# Patient Record
Sex: Female | Born: 1980
Health system: Southern US, Community
[De-identification: ages and names within clinical notes are randomized; demographics above are authoritative.]

## PROBLEM LIST (undated history)

## (undated) DIAGNOSIS — N920 Excessive and frequent menstruation with regular cycle: Secondary | ICD-10-CM

## (undated) DIAGNOSIS — R011 Cardiac murmur, unspecified: Secondary | ICD-10-CM

## (undated) DIAGNOSIS — F419 Anxiety disorder, unspecified: Secondary | ICD-10-CM

## (undated) DIAGNOSIS — F319 Bipolar disorder, unspecified: Secondary | ICD-10-CM

## (undated) DIAGNOSIS — E282 Polycystic ovarian syndrome: Secondary | ICD-10-CM

## (undated) DIAGNOSIS — E663 Overweight: Secondary | ICD-10-CM

## (undated) DIAGNOSIS — G43909 Migraine, unspecified, not intractable, without status migrainosus: Secondary | ICD-10-CM

## (undated) DIAGNOSIS — N926 Irregular menstruation, unspecified: Secondary | ICD-10-CM

## (undated) DIAGNOSIS — Z319 Encounter for procreative management, unspecified: Secondary | ICD-10-CM

## (undated) DIAGNOSIS — L700 Acne vulgaris: Secondary | ICD-10-CM

## (undated) DIAGNOSIS — L689 Hypertrichosis, unspecified: Secondary | ICD-10-CM

## (undated) DIAGNOSIS — N6324 Unspecified lump in the left breast, lower inner quadrant: Principal | ICD-10-CM

## (undated) DIAGNOSIS — B373 Candidiasis of vulva and vagina: Secondary | ICD-10-CM

## (undated) DIAGNOSIS — N898 Other specified noninflammatory disorders of vagina: Principal | ICD-10-CM

## (undated) HISTORY — DX: Hypertrichosis, unspecified: L68.9

## (undated) HISTORY — DX: Other specified noninflammatory disorders of vagina: N89.8

## (undated) HISTORY — DX: Irregular menstruation, unspecified: N92.6

## (undated) HISTORY — DX: Migraine, unspecified, not intractable, without status migrainosus: G43.909

## (undated) HISTORY — DX: Excessive and frequent menstruation with regular cycle: N92.0

## (undated) HISTORY — DX: Cardiac murmur, unspecified: R01.1

## (undated) HISTORY — DX: Encounter for procreative management, unspecified: Z31.9

## (undated) HISTORY — DX: Acne vulgaris: L70.0

## (undated) HISTORY — DX: Overweight: E66.3

## (undated) HISTORY — DX: Bipolar disorder, unspecified: F31.9

## (undated) HISTORY — DX: Anxiety disorder, unspecified: F41.9

## (undated) HISTORY — DX: Candidiasis of vulva and vagina: B37.3

## (undated) HISTORY — DX: Polycystic ovarian syndrome: E28.2

## (undated) HISTORY — DX: Unspecified lump in the left breast, lower inner quadrant: N63.24

---

## 2002-01-24 ENCOUNTER — Encounter: Payer: Self-pay | Admitting: Emergency Medicine

## 2002-01-24 ENCOUNTER — Emergency Department (HOSPITAL_COMMUNITY): Admission: EM | Admit: 2002-01-24 | Discharge: 2002-01-24 | Payer: Self-pay | Admitting: Emergency Medicine

## 2003-01-05 ENCOUNTER — Other Ambulatory Visit: Admission: RE | Admit: 2003-01-05 | Discharge: 2003-01-05 | Payer: Self-pay | Admitting: Unknown Physician Specialty

## 2003-04-02 ENCOUNTER — Emergency Department (HOSPITAL_COMMUNITY): Admission: EM | Admit: 2003-04-02 | Discharge: 2003-04-02 | Payer: Self-pay | Admitting: Emergency Medicine

## 2003-12-21 ENCOUNTER — Emergency Department (HOSPITAL_COMMUNITY): Admission: EM | Admit: 2003-12-21 | Discharge: 2003-12-21 | Payer: Self-pay | Admitting: Emergency Medicine

## 2004-02-14 ENCOUNTER — Inpatient Hospital Stay (HOSPITAL_COMMUNITY): Admission: EM | Admit: 2004-02-14 | Discharge: 2004-02-18 | Payer: Self-pay | Admitting: Psychiatry

## 2004-02-14 ENCOUNTER — Ambulatory Visit: Payer: Self-pay | Admitting: Psychiatry

## 2004-02-23 ENCOUNTER — Ambulatory Visit (HOSPITAL_COMMUNITY): Admission: RE | Admit: 2004-02-23 | Discharge: 2004-02-23 | Payer: Self-pay | Admitting: Otolaryngology

## 2004-07-13 ENCOUNTER — Emergency Department (HOSPITAL_COMMUNITY): Admission: EM | Admit: 2004-07-13 | Discharge: 2004-07-13 | Payer: Self-pay | Admitting: Emergency Medicine

## 2005-12-01 ENCOUNTER — Ambulatory Visit: Payer: Self-pay | Admitting: Internal Medicine

## 2005-12-08 ENCOUNTER — Ambulatory Visit: Payer: Self-pay | Admitting: Internal Medicine

## 2005-12-08 ENCOUNTER — Ambulatory Visit (HOSPITAL_COMMUNITY): Admission: RE | Admit: 2005-12-08 | Discharge: 2005-12-08 | Payer: Self-pay | Admitting: Internal Medicine

## 2005-12-10 HISTORY — PX: BUNIONECTOMY: SHX129

## 2005-12-17 ENCOUNTER — Ambulatory Visit (HOSPITAL_COMMUNITY): Admission: RE | Admit: 2005-12-17 | Discharge: 2005-12-17 | Payer: Self-pay | Admitting: Podiatry

## 2007-04-16 ENCOUNTER — Ambulatory Visit (HOSPITAL_COMMUNITY): Admission: RE | Admit: 2007-04-16 | Discharge: 2007-04-16 | Payer: Self-pay | Admitting: Family Medicine

## 2007-04-20 ENCOUNTER — Ambulatory Visit: Payer: Self-pay | Admitting: Internal Medicine

## 2007-05-13 HISTORY — PX: COLONOSCOPY: SHX174

## 2007-05-28 ENCOUNTER — Ambulatory Visit: Payer: Self-pay | Admitting: Urgent Care

## 2007-06-04 ENCOUNTER — Ambulatory Visit: Payer: Self-pay | Admitting: Internal Medicine

## 2007-06-04 ENCOUNTER — Ambulatory Visit (HOSPITAL_COMMUNITY): Admission: RE | Admit: 2007-06-04 | Discharge: 2007-06-04 | Payer: Self-pay | Admitting: Internal Medicine

## 2007-07-06 ENCOUNTER — Ambulatory Visit: Payer: Self-pay | Admitting: Internal Medicine

## 2008-03-23 DIAGNOSIS — Z8719 Personal history of other diseases of the digestive system: Secondary | ICD-10-CM | POA: Insufficient documentation

## 2008-03-23 DIAGNOSIS — K219 Gastro-esophageal reflux disease without esophagitis: Secondary | ICD-10-CM | POA: Insufficient documentation

## 2008-03-23 DIAGNOSIS — R011 Cardiac murmur, unspecified: Secondary | ICD-10-CM | POA: Insufficient documentation

## 2008-03-23 DIAGNOSIS — R109 Unspecified abdominal pain: Secondary | ICD-10-CM | POA: Insufficient documentation

## 2008-03-23 DIAGNOSIS — G43909 Migraine, unspecified, not intractable, without status migrainosus: Secondary | ICD-10-CM | POA: Insufficient documentation

## 2008-03-23 DIAGNOSIS — K5900 Constipation, unspecified: Secondary | ICD-10-CM | POA: Insufficient documentation

## 2008-06-01 ENCOUNTER — Other Ambulatory Visit: Admission: RE | Admit: 2008-06-01 | Discharge: 2008-06-01 | Payer: Self-pay | Admitting: Obstetrics and Gynecology

## 2008-06-06 ENCOUNTER — Ambulatory Visit (HOSPITAL_COMMUNITY): Admission: RE | Admit: 2008-06-06 | Discharge: 2008-06-06 | Payer: Self-pay | Admitting: Obstetrics and Gynecology

## 2008-10-07 ENCOUNTER — Emergency Department (HOSPITAL_COMMUNITY): Admission: EM | Admit: 2008-10-07 | Discharge: 2008-10-07 | Payer: Self-pay | Admitting: Emergency Medicine

## 2010-04-03 ENCOUNTER — Other Ambulatory Visit
Admission: RE | Admit: 2010-04-03 | Discharge: 2010-04-03 | Payer: Self-pay | Source: Home / Self Care | Admitting: Obstetrics and Gynecology

## 2010-09-24 NOTE — Op Note (Signed)
NAME:  [name], [name]              ACCOUNT NO.:  [id number]   MEDICAL RECORD NO.:  [id number]          PATIENT TYPE:  AMB   LOCATION:  DAY                           FACILITY:  [facility]   PHYSICIAN:  R. Roetta Sessions, M.D. DATE OF BIRTH:  06-08-1980   DATE OF PROCEDURE:  06/04/2007  DATE OF DISCHARGE:                               OPERATIVE REPORT   ILEOCOLONOSCOPY:   INDICATIONS FOR PROCEDURE:  A 30 year old lady with a history of  irritable bowel syndrome who previously had diarrhea predominant, right  now has been more constipated over last several months and has had a  suboptimal response to Amitiza and MiraLax.  She has wiped blood per  rectum on occasion.  Colonoscopy is now being done.  This approach has  been discussed with the patient at length.  Potential risks, benefits  and alternatives have been reviewed, questions answered, she is  agreeable.  Please see documentation in the medical record.   PROCEDURE NOTE:  O2 saturation, blood pressure, pulse and respirations  were monitored throughout the entire procedure.  Conscious sedation:  Versed 5 mg IV, Demerol 125 mg IV in divided doses.  Instrument:  Pentax  video chip system.   FINDINGS:  Digital rectal exam revealed no abnormalities.  Endoscopic  findings:  The prep was good.   Colon:  Colonic mucosa was surveyed from the rectosigmoid junction  through the left, transverse and right colon to the area of the  appendiceal orifice, ileocecal valve and cecum.  These structures were  well-seen and photographed for the record.  The terminal ileum was  intubated 5 cm.  From this level the scope was slowly withdrawn.  All  previously-mentioned mucosal surfaces were again seen.  The colonic  mucosa as well as the terminal ileal mucosa appeared entirely normal.  The scope was pulled down to the rectum.  Examination of the rectal  mucosa including retroflex view of the anal verge and en face view of  the anal canal demonstrated no  abnormalities.  The patient tolerated the  procedure well, was reacted in endoscopy.   IMPRESSION:  Normal rectum, colon and terminal ileum.   RECOMMENDATIONS:  Will revisit the use of MiraLax and we will escalate  the dose.  We will hold off on the Amitiza, although I may consider  adding that back to her regimen.  Depending on her clinical response in  the next several weeks, may consider a sitz marker study.  Plan to see  this nice lady back in the office in 6 weeks for a recheck.      Jonathon Bellows, M.D.  Electronically Signed    RMR/MEDQ  D:  06/04/2007  T:  06/04/2007  Job:  [id number]

## 2010-09-24 NOTE — Assessment & Plan Note (Signed)
NAME:  Sue, Butler               CHART#:  [id number]   DATE:  04/20/2007                       DOB:  [birth year]   CHIEF COMPLAINT:  Abdominal pain, alternating constipation, diarrhea,  and gastroesophageal reflux disease.   SUBJECTIVE:  [name] is here for further evaluation of above stated  symptoms. We last saw her in July 2007. She has a history of GERD. At  time of EGD in July 2007, she had a normal appearing study. Her  esophagus was stretched with a #56 [place] dilator. She had a  small hiatal hernia. She presents today saying that she has been having  fluctuation of her bowel movement, which has occurred for quite some  time. She generally predominantly has more diarrhea but over the last  couple of months, she has had more constipation. She may go 3 or 4 days  in a row without a bowel movement. She feels very bloated and is  uncomfortable at that point. She saw Margarita Mail, [facility], with Dr. Phillips Odor  last week. She was given Amitiza 24 mcg, which she is taking b.i.d. She  has had some nausea related to that but it has helped her bowel  movements. She is having up to 5 or 6 loose stools now. She denies any  blood in the stool. No melena. She is having reflux all the time. It  occurs day and night. She denies any dysphagia or odynophagia or  vomiting. She denies any recent antibiotic use. Her only medication  changes is that she has been on Topamax for migraine headaches since May  of this year. She did well until a couple of months ago. She was started  on Neurontin and she wondered if this was causing her constipation. She  came off the medication about a week ago. She denies any weight loss and  actually has had some weight gain. According to our records, she has  gained about 3 pounds in the last year. She notes that her mother passed  away unexpectedly at age 45 back in May of this year. Cause is really  unknown, possibly related to her asthma.   She saw Margarita Mail  on April 16, 2007. Labs including CBC, C-met, and  TSH were all normal. An abdominal film was unremarkable.   CURRENT MEDICATIONS:  1. Ibuprofen 200 mg p.r.n.  2. Amitiza 24 mcg b.i.d.  3. Topamax 75 mg daily.   ALLERGIES:  NO KNOWN DRUG ALLERGIES.   PAST MEDICAL HISTORY:  1. Migraine headaches since May of this year.  2. History of heart murmur.   PAST SURGICAL HISTORY:  She had a left foot surgery for a bunion.   FAMILY HISTORY:  Mother deceased age 43, suddenly, cause really unknown.  Father is 1 and has hypertension. No family history of colorectal  cancer or inflammatory bowel disease. She has a younger brother who is  healthy.   SOCIAL HISTORY:  She is single. She is employed at [employer] as  a Engineer, materials. She is a non-smoker and occasional alcohol use.   REVIEW OF SYSTEMS:  GASTROINTESTINAL:  See HPI.  CONSTITUTIONAL/CARDIOPULMONARY:  Denies chest pain or shortness of  breath. GENITOURINARY:  Denies any dysuria or hematuria. Her menstrual  cycles are regular.   PHYSICAL EXAMINATION:  VITAL SIGNS:  Weight 224.5. Height 5 foot 1.  Temperature 98.6. Blood pressure 110/76, pulse 64.  GENERAL:  A pleasant, well developed, well nourished, Caucasian female  in no acute distress.  SKIN:  Warm and dry. No jaundice.  HEENT:  Sclerae nonicteric. Oropharyngeal mucosa moist and pink. No  lesions, no erythema, and no exudate. No lymphadenopathy.  CHEST:  Clear to auscultation.  CARDIAC:  Regular rate and rhythm. Normal S1 and S2. No murmur, rub, or  gallop.  ABDOMEN:  Positive bowel sounds. Obese but symmetrical and soft. She has  tenderness throughout her lower abdomen, predominantly in the left lower  quadrant region. No rebound, tenderness or guarding. No abdominal bruits  or hernias. No hepatosplenomegaly or masses.  EXTREMITIES:  No lower extremity edema.   IMPRESSION:  [name] is a 30 year old lady who presents with a  fluctuation of bowel movements from  constipation and diarrhea. In the  past, she has had predominantly diarrhea. Symptoms are very typical of  irritable bowel syndrome. She also has refractory gastroesophageal  reflux disease but has not been on PPI recently. Just restarted on  Protonix. Has noted some improvement of her symptoms. She really is not  displaying any alarm symptoms at this time. Would continue with empiric  treatment and if she does not respond, will consider further workup at  that time.   PLAN:  1. Hemoccult stool x3.  2. Hyomax sublingual q. a.c. and q.h.s. p.r.n. abdominal cramps, #120      with 3 refills.  3. Flora Q  1 daily for 1 month.  4. Stop Protonix. Begin Nexium 40 mg daily, #20, samples provided. She      did well on Nexium previously.  5. She will decrease her Amitiza to 8 mcg b.i.d. with food, only as      needed for constipation. She was advised to stop if she developed      persistent diarrhea, #20, samples provided.  6. She was advised that she needed to use some sort of birth control      while on Amitiza due to risk of miscarriage related to the      medication or birth defects. She agrees and understands.  7. She will call in 2 weeks with a progress report. If she is not      feeling better at that point, will consider further workup.       Tana Coast, P.A.  Electronically Signed     R. Roetta Sessions, M.D.  Electronically Signed    LL/MEDQ  D:  04/20/2007  T:  04/20/2007  Job:  [id number]   cc:   Corrie Mckusick, M.D.  Margarita Mail, [facility]

## 2010-09-24 NOTE — H&P (Signed)
NAME:  Sue, Butler              ACCOUNT NO.:  [id number]   MEDICAL RECORD NO.:  [id number]          PATIENT TYPE:  AMB   LOCATION:  DAY                           FACILITY:  [facility]   PHYSICIAN:  R. Roetta Sessions, M.D. DATE OF BIRTH:  07-Jul-1980   DATE OF ADMISSION:  DATE OF DISCHARGE:  LH                              HISTORY & PHYSICAL   PRIMARY CARE PHYSICIAN:  Dr. Phillips Odor.   CHIEF COMPLAINT:  Change in bowel habits/hematochezia.   HISTORY OF PRESENT ILLNESS:  [name] is a 30 year old female who has  been followed by our office for constipation for the last month.  She  has a history of IBS which has been diarrhea-predominant.  More recently  she has developed constipation.  She has gone several days without a  bowel movement.  She has tried MiraLax 17 grams b.i.d. for about 2  weeks, which has not seemed to help.  She has also tried Amitiza 24 mcg,  caused nausea.  She was decreased to 8 mcg, and she continued to have  problems with nausea and felt that the medicine was not working.  She  has tried some herbal supplements and fiber on her own and did not get  any relief.  She says that she feels as though she needs to go the  bathroom, and is having a significant amount of straining, but without  results.  This is completely different from her normal baseline of  diarrhea.  She has noticed some intermittent small-volume hematochezia,  with wiping on the toilet tissue.  She is also having severe, stabbing-  type rectal pain.  She denies any nausea, vomiting.  Her GERD is well-  controlled on Nexium.   She has had cramp-like intermittent right upper quadrant and left upper  quadrant pain, can last for several hours, is 8/10 on pain scale and  relatively constant.   LABORATORY:  She had laboratory studies on April 16, 2007, which  showed a normal TSH, normal CMP and normal CBC.   PAST MEDICAL AND SURGICAL HISTORY:  Chronic GERD, well-controlled on  PPI.  She had a an EGD by  Dr. Jena Gauss on December 08, 2005, which was normal.  She was dilated with a 56-[facility] dilators, with history of solid  food dysphagia.  She has a history of left foot bunionectomy, heart  murmur and migraine headaches.   CURRENT MEDICATIONS:  1. Nexium 40 mg daily.  2. Ibuprofen 200 mg p.r.n.  3. Topamax 75 mg daily.  4. Senna T b.i.d.  5. Xanax 0.5 mg q.h.s. p.r.n.   ALLERGIES:  NO KNOWN DRUG ALLERGIES.   FAMILY HISTORY:  There is no known family history of colorectal  carcinoma or chronic GI problems.  Mother is age 53 with asthma, she  died suddenly this years, cause unknown.  Father age 76, has  hypertension.  She has one healthy brother.  She is employed at a bank.  She denies any tobacco or drug use.  She has occasional alcohol use.   SOCIAL HISTORY:  [name] is single.  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  She denies any tobacco or drug use.  She has  occasional alcohol use.   REVIEW OF SYSTEMS:  See HPI, otherwise negative.   PHYSICAL EXAM:  VITAL SIGNS:  Weight 221 pounds, height 61 inches, temp  99 degrees, blood pressure 104/70, pulse 88.  GENERAL:  Well-developed, well-nourished, obese, Caucasian female in no  acute distress.  HEENT:  Sclerae clear, nonicteric.  Oropharynx pink and moist without  any lesions.  NECK:  Supple without mass or thyromegaly.  CHEST:  Heart regular rate and rhythm.  Normal S1-S2 without murmurs,  clicks,  rubs or gallops.  LUNGS:  Clear to auscultation bilaterally.  ABDOMEN:  Soft, nontender, nondistended.  Palpable mass or  hepatosplenomegaly.  No rebound tenderness or guarding.  EXTREMITIES:  Without clubbing or edema bilaterally.  SKIN:  Pink, warm, dry without any rash or jaundice.  RECTAL:  No external lesions visualized.  Internal exam:  She has some  palpable fullness posteriorly inside the rectum, also on digital rectal  exam.  Just beyond my finger, there is a palpable fullness posteriorly.  I am unable to  get a good assessment of this area.  A small amount of  light brown stool was obtained from the vault, which was Hemoccult  negative.   IMPRESSION:  [name] is a 30 year old female with a marked difference  in her bowel movements.  She has history of diarrhea, predominantly IBS,  now with a 7-month history of difficulty with initiating a bowel  movement and some small volume intermittent hematochezia and  constipation/obstipation.  Her symptoms are concerning, and I do feel  she wants further evaluation via colonoscopy and will set this up with  Dr. Jena Gauss in the near future to rule out of colorectal neoplasia.   PLAN:  1. Colonoscopy with Dr. Jena Gauss.  I discussed the procedure including      risks and benefits which include but not limited to blood      infection, perforation, drug reaction.  She agrees with plan.      Consent will be obtained.  I encouraged she to continue MiraLax 17      grams daily for now.  2. Further recommendations pending procedure.      Lorenza Burton, N.P.      Jonathon Bellows, M.D.  Electronically Signed    KJ/MEDQ  D:  05/28/2007  T:  05/28/2007  Job:  [id number]   cc:   Corrie Mckusick, M.D.  Fax: [phone]

## 2010-09-24 NOTE — Assessment & Plan Note (Signed)
NAME:  ERON, GOBLE               CHART#:  [id number]   DATE:  07/06/2007                       DOB:  [birth year]   CHIEF COMPLAINT:  Followup of constipation.   SUBJECTIVE:  [name] is here for followup. She recently underwent ileo-  colonoscopy on June 04, 2007 because of history of change in bowel  movements and occasional bleeding. Her rectum, colon, and terminal ileum  were all normal. She has a history of irritable bowel syndrome with  diarrhea predominant, however, more recently, has been more constipated.  She did not tolerate nausea related to Amitiza and it did not seem to  work well either. She states that ever since her colonoscopy, her bowel  movements have been more regular. She is using MiraLax only as needed,  usually once or twice a week. She denies any hard stools, melena, or  rectal bleeding. Denies any nausea and vomiting. No abdominal pain.  Overall, she feels well. Her reflux is well controlled.   CURRENT MEDICATIONS:  See updated list.   ALLERGIES:  NO KNOWN DRUG ALLERGIES.   PHYSICAL EXAMINATION:  VITAL SIGNS:  Weight 223. Temperature 98.6, blood  pressure 110/70, pulse 60.  GENERAL:  Well developed, well nourished, pleasant Caucasian female in  no acute distress.  SKIN:  Warm and dry. No jaundice.  HEENT:  Sclerae nonicteric.  ABDOMEN:  Soft.   IMPRESSION:  The patient is a 30 year old lady with history of irritable  bowel syndrome, more recently constipation predominant, doing well on  p.r.n. MiraLax. Chronic gastroesophageal reflux disease well controlled  on Nexium.   PLAN:  Office visit p.r.n.       Tana Coast, P.A.  Electronically Signed     R. Roetta Sessions, M.D.  Electronically Signed    LL/MEDQ  D:  07/06/2007  T:  07/06/2007  Job:  [id number]

## 2010-09-27 NOTE — Op Note (Signed)
[name], [name]              ACCOUNT NO.:  [id number]   MEDICAL RECORD NO.:  [id number]          PATIENT TYPE:  AMB   LOCATION:  DAY                           FACILITY:  [facility]   PHYSICIAN:  Denny Peon. Ulice Brilliant, D.P.M.  DATE OF BIRTH:  [birth year]   DATE OF PROCEDURE:  12/17/2005  DATE OF DISCHARGE:                                 OPERATIVE REPORT   PREOPERATIVE DIAGNOSES:  1.  Hallux abductovalgus deformity, left foot.  2.  Hallux abductus interphalangeus deformity, proximal phalanx, great toe,      left foot.  3.  Mallet toe deformity, second digit, left foot.  4.  Hammer digit, fifth digit, left foot.   POSTOPERATIVE DIAGNOSES:  1.  Hallux abductovalgus deformity, left foot.  2.  Hallux abductus interphalangeus deformity, proximal phalanx, great toe,      left foot.  3.  Mallet toe deformity, second digit, left foot.  4.  Hammer digit, fifth digit, left foot.   PROCEDURES PERFORMED:  1.  Austin bunionectomy, left foot.  2.  Distal Akin osteotomy, proximal phalanx, left foot.  3.  Distal interphalangeal joint arthroplasty, second digit left foot.  4.  Derotation arthroplasty, fifth digit, left foot.   SURGEON:  Denny Peon. Ulice Brilliant, D.P.M.   ANESTHESIA:  Monitored anesthesia care.   ANESTHESIOLOGIST:  Johnney Killian, M.D.   INDICATIONS FOR SURGERY:  Greater than 10-year painful longstanding bunion  deformities bilaterally to the point now where only non-enclosed shoes such  as sandals are somewhat comfortable.  The patient has decided she will do  the left foot first and we will do the right at a later date.   DESCRIPTION OF PROCEDURE:  [name] was brought in the OR and placed on  the table in supine position.  IV sedation was established.  A Mayo block is  performed about the first MTP of the left foot.  Further local anesthesia is  administered to the hallux, the base of the second toe and the base of the  fifth toe.  A pneumatic ankle tourniquet was then applied across  [name]'s  left ankle.  Her foot is prepped and draped in the usual aseptic fashion.  An Ace bandage is utilized to exsanguinate her foot.  The tourniquet is  inflated to 250 mmHg.   Procedure 1:  Austin bunionectomy, left foot.  Attention was directed to  this first MTP of the left foot.  A 7 cm curvilinear incision was planned  with a skin marking pen and then carried forth utilizing a #15 blade.  Incisions are deepened by sharp and blunt dissection through subcutaneous  tissues with care taken to avoid neurovascular structures and get good  exposure to the medial aspect of the first MTP.  With this exposure  complete, an inverted L capsulotomy is performed.  The capsule and  periosteal tissues are reflected away from the underlying bony surface of  the dorsal medial aspect of the first metatarsal head.  The medial eminence  of the first metatarsal is delivered to the surgical wound and excised using  a 63 lipped blade on the  oscillating saw.  Attention is then directed into  the first interspace through the original skin incision.  A Weitlaner self-  retaining retractor is utilized to aid in exposure.  A Metzenbaum scissors  is utilized to carry dissection down to the level of the fibular sesamoid.  An abductor hallucis tenotomy is then performed.  Attention is then directed  back to the first metatarsal head.  A 0.045 K-wire was introduced and  utilized to act as a pin access guide.  The [place] cuts are then made with  the #63 blade on the oscillating saw.  The capital fragment is relocated  approximately 50% across the width of the metatarsal surface.  This is then  fixed by two 0.045 K-wires driven in a divergent fashion with care taken to  ensure there is no redundant protrusion of K-wires through the articular  surface.  The osteotomy is deemed very stable with these two K-wires.  The  redundant bone medially is excised.  The newly-formed osseous surface is  rasped smooth.  It is  of note that during this procedure that on multiple  occasions we utilize the intraoperative Zyscan for confirmation of the  amount of movement of the bone, fixation, etc.  Once the newly-formed  osseous surface is rasped smooth, the K-wires are bent close to the  metatarsal surface, cut, and then rotated so that they lay flush.  The wound  is then flushed again with copious amounts of irrigant.  Capsular tissues  are reapproximated and an approximate 2 mm rectangular section of redundant  medial joint capsule is excised.  Capsule tissues are then reapproximated  and closed with 3-0 Vicryl.  Subcutaneous tissues are then reapproximated  and closed with 4-0 Vicryl.  Skin is closed with 4-0 Vicryl in a running  horizontal mattress suture.   Procedure #2:  Distal Akin osteotomy of left foot.  Attention is then  directed to the medial aspect of the great toe.  The dorsal and plantar  surfaces of the proximal phalanx are palpated and appreciated.  An incision  is then made on the medial aspect of the toe in the approximate midline of  the proximal phalanx.  The incision is made with a #15 blade and deepened by  sharp and blunt dissection through subcutaneous tissues.  Care is taken to  get good exposure to the distal aspect of the proximal phalanx medially.  The interphalangeal joint is appreciated.  The first cut for the Akin is the  more distal of the two cuts, paralleling the articular surface of the  proximal phalanx.  This osteotomy is made approximately 90% across the width  of the proximal phalanx.  The second cut is then made in a slightly oblique  fashion, creating a wedge with the apex being lateral and the base being  medial.  This creates a wedge of bone which is excised.  The osteotomy is  then weakened by further penetration of the lateral aspect of the cortical  surface so that the osteotomy can be closed down upon itself.  Again this is visualized with the Zyscan.  This is then  fixated by two 0.045 K-wires  driven in an X-type fashion across the osteotomy.  The osteotomy is deemed  stable and the amount of correction is deemed good.  The K-wires are then  exited both distally, medially and laterally in about the same position on  the toe in the medial and lateral orientation.  The wound is flushed.  The  K-  wires are bent close to the skin surface and cut and then pin caps are  applied across these.  The wound is flushed and closure consists of deep  fascia with 4-0 Vicryl and then skin utilizing 4-0 Prolene in simple  interrupted and horizontal mattress sutures.   Procedure #3:  Distal interphalangeal arthroplasty, second toe, left foot.  Attention is then directed to the second toe.  A teardrop-shaped incision  with apex being lateral and the wider portion being medial is planned across  the distal interphalangeal of the second toe.  The incision is then carried  forth and the elliptical wedge of skin is removed.  The distal  interphalangeal is then entered by a dorsal transverse incision.  The  extensor tendon dorsally is undermined and retracted.  An approximate 4 mm  wedge of intermediate phalanx is then excised, basically the head of the  intermediate phalanx.  The extensor tendon is removed, which is redundant.  The toe is then closed down upon itself and found to be rectus and then  closed with the skin edges being coapted with 4-0 Prolene in a combination  of simple interrupted and horizontal mattress sutures.   Procedure #4:  Derotation arthroplasty, fifth digit of left foot.  Attention  is directed to the fifth toe.  Two semielliptical curvilinear skin incisions  are planned with the orientation being from dorsal distal medial to plantar  lateral proximal.  The skin incision is planned out and then carried forth  with the skin blade.  The resultant elliptical wedge of skin is excised.  The proximal interphalangeal joint is entered by a dorsal  transverse  incision.  The extensor tendon is undermined.  A double-action bone forceps  is utilized to remove the head of the proximal phalanx.  The wound is then  flushed with copious amounts of irrigant and 4-0 Prolene is then utilized to  close this wound with the toe being rectus at this point.  Postoperative  injection of the second, fifth and the great toe and the first metatarsal is  then performed with Marcaine and dexamethasone.  Steri-Strips are applied  across the [place] incision.  A postoperative dressing encompassing all four  operative sites is then applied to the foot and then contained within a  [place] dressing.  The tourniquet is then deflated with the tourniquet time  for this procedure spanning 96 minutes.   [name] is then transported from operating room 1 to day hospital.  While  there a list of written instructions was explained to her and her family.  I  have instructed her on keeping her foot moving at the ankle to ensure that the cast stays supple and reduces the chances of a DVT.  Ice and elevation  are encouraged.  A postoperative prescription for hydrocodone 10/650 mg and  Phenergan is dispensed.  She will be seen within 7 days for first  postoperative visit.  Written instructions were explained by myself and both  the postoperative nurse.           ______________________________  Denny Peon. Ulice Brilliant, D.P.M.     CMD/MEDQ  D:  12/17/2005  T:  12/17/2005  Job:  [id number]

## 2010-09-27 NOTE — Consult Note (Signed)
NAMEJADEN, [name]              ACCOUNT NO.:  [id number]   MEDICAL RECORD NO.:  [id number]          PATIENT TYPE:  AMB   LOCATION:                                FACILITY:  [facility]   PHYSICIAN:  R. Roetta Sessions, M.D. DATE OF BIRTH:  09/27/1980   DATE OF CONSULTATION:  12/01/2005  DATE OF DISCHARGE:                                   CONSULTATION   REASON FOR CONSULTATION:  Reflux symptoms, esophageal dysphagia, diarrhea.   [name] is a pleasant 30 year old obese Caucasian female sent  over by Jimmy Footman, P.A. and Dorthey Sawyer of [facility] to further  evaluate a greater than 1-year history of reflux symptoms.  She is taking  Nexium 40 mg orally in the evening well after a meal and is having break-  through reflux symptoms each morning.  She started Nexium back in January.  She also has had diarrhea since January, intermittent, nonbloody.  She has  not lost any weight.  She does have intermittent esophageal dysphagia to  solids and describes transient food impactions from time to time.  She  almost had completely eliminated meat and bread from her diet because of  this problem.  She has notably gained 40 pounds over the past 5 years  insidiously.  She does not use tobacco, she rarely consumes alcohol.  She  has long hours at her job at the [employer] inside [employer] and she  has a tendency of eating late at night not long before going to bed.  She  takes about two ibuprofen weekly for various aches and pains but does not  use any other nonsteroidals or other over-the-counter agents.  There is no  family history of GI malignancy or other chronic gastrointestinal problem  including irritable bowel disease.   PAST MEDICAL HISTORY:  Unremarkable for chronic illnesses.   PAST SURGERIES:  None.  She is slated to have foot surgery August 8.   CURRENT MEDICATIONS:  1.  Birth control pill.  2.  Nexium 40 mg daily.  3.  Bentyl 10 mg t.i.d.  4.  Ibuprofen 200 mg  daily p.r.n.   ALLERGIES:  No known drug allergies.   FAMILY HISTORY:  Mother is age 16 with asthma.  Father is age 55 with  hypertension.  She has one brother.  No history of chronic GI or liver  illness.   SOCIAL HISTORY:  The patient is single.  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  No tobacco, occasional alcoholic  beverage.   REVIEW OF SYSTEMS:  No chest pain, no dyspnea on exertion.  No weight loss,  fever, chills, night sweats.   PHYSICAL EXAMINATION:  GENERAL:  Reveals a pleasant 30 year old lady resting  comfortably.  VITAL SIGNS:  Weight 221, height 5 feet 2 inches.  Temperature 97.9, BP  126/82, pulse 62.  SKIN:  Warm and dry, no jaundice.  No cutaneous stigmata of chronic liver  disease.  HEENT:  No scleral icterus.  JVD is not prominent.  CHEST:  Lungs are clear to auscultation.  CARDIAC:  Regular rate and rhythm without murmur, gallop, or rub.  ABDOMEN:  Nondistended, obese, positive bowel sounds, soft, nontender,  without any obvious mass or organomegaly.  EXTREMITIES:  Have no edema.   ASSESSMENT:  [name] is a pleasant 30 year old lady with  gastroesophageal reflux disease symptoms refractory to Nexium.  She also has  esophageal dysphagia to solids.  This is in the setting of significant  insidious weight gain.   She is taking Nexium after meals which is a suboptimal way to take the  medication.  We discussed a multi-pronged approach to gastroesophageal  reflux disease.  She does have the alarm features of esophageal dysphagia  that warrants further evaluation.  It may well be that this lady's diarrhea  which has been present since January is related to Nexium itself as symptoms  correlate to the beginning of the medication use.  I suggested to [name]  if she could lose even 20 pounds in the next 12 months this would go a long  way to possibly impacting her reflux.  She really needs to cut out late  night meals and  go to bed with an empty stomach and not eat within 3 hours  of going to bed.  I am going to start the Nexium and start her on some  AcipHex 20 mg orally daily.  I have given her a 3-week supply of samples.  I  have offered her an EGD with esophageal dilation as appropriate in the near  future at [facility] and will make further recommendations in the  near future.   I would like to thank Jimmy Footman, P.A. and Dorthey Sawyer, M.D. for allowing me  to see this very nice lady today.      Jonathon Bellows, M.D.  Electronically Signed     RMR/MEDQ  D:  12/01/2005  T:  12/01/2005  Job:  [id number]

## 2010-09-27 NOTE — H&P (Signed)
[name], [name]              ACCOUNT NO.:  [id number]   MEDICAL RECORD NO.:  [id number]          PATIENT TYPE:  AMB   LOCATION:                                FACILITY:  [facility]   PHYSICIAN:  Denny Peon. Ulice Brilliant, D.P.M.  DATE OF BIRTH:  1980-08-01   DATE OF ADMISSION:  12/17/2005  DATE OF DISCHARGE:  LH                                HISTORY & PHYSICAL   HISTORY OF PRESENT ILLNESS:  Long-standing history of bilateral bunion and  hammer digit pain of both feet; left actually hurts worse now than right.  [name] is a 30 year old white female, who initially sought treatment for  this as a teenager and was instructed to wait until her bones matured before  she had surgery.  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  She actually has tried to wear sandals to work, only  to be reprimanded for that.   PAST MEDICAL HISTORY:  Significant for esophageal reflux.   She is on Nexium at this point and she takes birth control medication.   She does not drink.  She smokes on occasion.  She lists Dr. Phillips Odor as her  PCP.   She has no known drug allergies.   OBJECTIVELY:  GENERAL:  Overweight, friendly, 30 year old white female,  noted to have a pronounced HAV deformity and HAI deformity of first  metatarsal and great toe of left foot.  Second digit is also noted to be  deviated distally in a lateral direction, as well as having an adductovarus  hammertoe of the fifth digit of her left foot.  Radiographic analysis  reveals a large HAV deformity with an increase in the intermetatarsal angle,  as well as a hallux abductus interphalangeus deformity noted within the  distal aspect of the proximal phalanx.  She is also noted to have deviation  of the second digit, mostly evident in the intermediate phalanx, with a  lateral deviation of the distal interphalangeal joint.  She is also noted  to  have the above-noted adductovarus hammertoe of the fifth digit.   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   PLAN:  She has basically gone long enough conservatively to the point now  where all shoes hurt, except for open-toed open-topped shoes, such as a  sandal.  She has requested surgical correction.  She is going to require an  Recruitment consultant and a distal Akin osteotomy of the proximal phalanx,  along with a DIPJ arthroplasty and a derotational arthroplasty of the fifth  toe.  The DIPJ arthroplasty would be on the second digit.  I have discussed  the procedure with [name] and her mother.  I have drawn diagrams out on  the x-ray folder, showing her what is involved in correction, including the  internal  fixation involved, both in the Letha and in the Akin.  We have  also  discussed the arthroplasties.  She has been consented for [facility] and distal  Akin of left foot, arthroplasty, second toe; arthroplasty, fifth toe of left  foot.  We will do this at [facility] under [facility].   The patient has read her consent form, apparently understood and signed.                                            ______________________________  Denny Peon. Ulice Brilliant, D.P.M.     CMD/MEDQ  D:  12/16/2005  T:  12/16/2005  Job:  [id number]

## 2010-09-27 NOTE — Discharge Summary (Signed)
NAMEMarland Kitchen  [name], [name] NO.:  [id number]   MEDICAL RECORD NO.:  [id number]          PATIENT TYPE:  IPS   LOCATION:  0302                          FACILITY:  [facility]   PHYSICIAN:  Jeanice Lim, M.D. DATE OF BIRTH:  November 11, 1980   DATE OF ADMISSION:  02/14/2004  DATE OF DISCHARGE:  02/18/2004                                 DISCHARGE SUMMARY   IDENTIFYING DATA:  This is a 30 year old single Caucasian female voluntarily  admitted.  Presenting with a history of feeling overwhelmed with school.  Feels no direction in her life.  Grandmother is dying.  Reported being  overwhelmed.  Feeling that she has to please everyone.  Was planning on  overdosing.  Had been compliant with medications.  Occasional alcohol use.   MEDICATIONS:  Lithium, Abilify (which she had been on for one week) and  Restoril.   ALLERGIES:  No known drug allergies.   PHYSICAL EXAMINATION:  Physical exam and neurological exam within normal  limits.   LABORATORY DATA:  Routine admission labs within normal limits.   MENTAL STATUS EXAM:  Alert, young, cooperative female.  Calm, articulate.  Mood overwhelmed.  Thought processes goal directed.  Thought content  negative for dangerous ideation or psychotic symptoms.  Judgment and insight  were fair.   ADMISSION DIAGNOSES:   AXIS I:  Bipolar disorder, mixed.   AXIS II:  Deferred.   AXIS III:  None.   AXIS IV:  Moderate (problems with education, other psychosocial issues).   AXIS V:  35/60.   HOSPITAL COURSE:  The patient was admitted and ordered routine p.r.n.  medications and underwent further monitoring.  Was encouraged to participate  in individual, group and milieu therapy.  The patient was gradually  stabilized on medications.  Clearly described mood instability and this was  targeted with medications, which she responded to well.  She tolerated  medications well and showed a stabilization of mood.   CONDITION ON DISCHARGE:  Discharged  euthymic.  Affect fuller.  No dangerous  ideation or psychotic symptoms.  No side effects from medications.  Developed an aftercare plan which was reasonable and had improved judgment  and insight.  She again was given medication education, especially regarding  the neurotherapeutic index with lithium and therefore dangerousness in  excessive doses or fluctuations from other medications or dehydrated states  and the importance of close monitoring as well as side effects to watch for.   DISCHARGE MEDICATIONS:  1.  Lithium 300 mg, 4 at 6 p.m.  2.  Cipro suspension to take as prescribed.  3.  Avelox 400 mg, 1 daily through February 21, 2004.  4.  Topamax 25 mg, 1 daily at bedtime.  5.  Seroquel 25 mg, 2 at 9 p.m.  6.  Ativan 0.5 mg, 1 up to three times a day p.r.n. panic.  Aware of risk      and physiologic dependence as well as addiction and withdrawal and      advised to take as little as needed and for short-term.   FOLLOW UP:  The patient was to take copy  of labs to Dr. Madaline Guthrie and [facility] and  discharge sheet were to be faxed.  The patient was to follow up with Dr.  Madaline Guthrie on Friday, February 23, 2004 at 12:00.   DISCHARGE DIAGNOSES:   AXIS I:  Bipolar disorder, mixed.   AXIS II:  Deferred.   AXIS III:  None.   AXIS IV:  Moderate (problems with education, other psychosocial issues).   AXIS V:  Global Assessment of Functioning on discharge 55.     Jame   JEM/MEDQ  D:  03/24/2004  T:  03/24/2004  Job:  [id number]

## 2010-09-27 NOTE — Op Note (Signed)
NAME:  [name], [name]              ACCOUNT NO.:  [id number]   MEDICAL RECORD NO.:  [id number]          PATIENT TYPE:  AMB   LOCATION:  DAY                           FACILITY:  [facility]   PHYSICIAN:  R. Roetta Sessions, M.D. DATE OF BIRTH:  11/05/1980   DATE OF PROCEDURE:  12/08/2005  DATE OF DISCHARGE:                                 OPERATIVE REPORT   PROCEDURE:  Esophagogastroduodenoscopy with Elease Hashimoto dilation.   INDICATIONS FOR PROCEDURE:  Patient is a 30 year old lady with progressive  gastroesophageal reflux disease symptoms and dysphagia of solids.  She has  been having diarrhea for several months.  She has been on Nexium.  We  stopped the Nexium and put her on some AcipHex.  This was associated with  marked improvement of her diarrheal symptoms, although she still has post  prandial diarrhea from time to time.  EGD is now being done.  The potential  for esophageal dilation reviewed.  Please see documentation on the medical  record.   PROCEDURE NOTE:  O2 saturation, blood pressure, pulses, and respirations  were monitored throughout the entire procedure.  Conscious sedation with  Versed 5 mg IV and Demerol 100 mg IV in divided doses.  Cetacaine spray for  topical oropharyngeal anesthesia.   INSTRUMENT:  Olympus video chip system.   FINDINGS:  Examination of the tubular esophagus revealed no mucosal  abnormalities.  The EG junction was easy to traverse.   STOMACH:  Gastric cavity was empty and insufflated well with air.  Thorough  examination of the gastric mucosa, included a retroflexed view of the  proximal stomach and the esophagogastric junction, demonstrated only a small  hiatal hernia.  The pylorus was patent and easily transversed.  Examination  of the bulb and second portion revealed no abnormalities.   THERAPEUTICS/DIAGNOSTIC MANEUVERS PERFORMED:  A 56 French Maloney dilator  was passed to full insertion with good patient tolerance.  A look back  revealed no apparent  complications related to passage of the dilator.  The  patient tolerated the procedure well and was reactive.   ENDOSCOPY IMPRESSION:  Normal-appearing esophagus, status post passage of a  51 French Maloney dilator, a small hiatal hernia, otherwise normal stomach,  D1, D2.   RECOMMENDATIONS:  Continue AcipHex but increase the dose to 20 mg before  breakfast and before supper, b.i.d. for six weeks, then drop back to once  before breakfast thereafter.  __________ 1 tablet on the tongue before  meals and at bedtime p.r.n. abdominal cramps and diarrhea.  GERD literature  provided to Ms. Smith.  I recommended she lost 30 pounds between now and the  end of the year.  This would go a long ways in helping her reflux symptoms.  I plan to see this nice lady back in the office in three months.      Jonathon Bellows, M.D.  Electronically Signed     RMR/MEDQ  D:  12/08/2005  T:  12/08/2005  Job:  [id number]   cc:   [facility]

## 2011-08-21 ENCOUNTER — Encounter: Payer: Self-pay | Admitting: Family Medicine

## 2011-09-25 ENCOUNTER — Encounter: Payer: Self-pay | Admitting: Family Medicine

## 2011-10-09 ENCOUNTER — Ambulatory Visit (INDEPENDENT_AMBULATORY_CARE_PROVIDER_SITE_OTHER): Payer: PRIVATE HEALTH INSURANCE | Admitting: Family Medicine

## 2011-10-09 VITALS — BP 115/74 | HR 78 | Temp 98.2°F | Resp 18 | Ht 61.0 in | Wt 227.4 lb

## 2011-10-09 DIAGNOSIS — J309 Allergic rhinitis, unspecified: Secondary | ICD-10-CM

## 2011-10-09 DIAGNOSIS — R300 Dysuria: Secondary | ICD-10-CM

## 2011-10-09 DIAGNOSIS — B373 Candidiasis of vulva and vagina: Secondary | ICD-10-CM

## 2011-10-09 LAB — POCT URINALYSIS DIPSTICK
Glucose, UA: NEGATIVE
Leukocytes, UA: NEGATIVE
Nitrite, UA: NEGATIVE
Protein, UA: NEGATIVE
Spec Grav, UA: 1.025
Urobilinogen, UA: 1
pH, UA: 6

## 2011-10-09 LAB — POCT UA - MICROSCOPIC ONLY
Casts, Ur, LPF, POC: NEGATIVE
Crystals, Ur, HPF, POC: NEGATIVE
Yeast, UA: NEGATIVE

## 2011-10-09 MED ORDER — FLUTICASONE PROPIONATE 50 MCG/ACT NA SUSP: NASAL | Status: DC

## 2011-10-09 MED ORDER — CEPHALEXIN 500 MG PO CAPS: 500.0000 mg | ORAL_CAPSULE | Freq: Three times a day (TID) | ORAL | Status: AC

## 2011-10-09 MED ORDER — FLUCONAZOLE 150 MG PO TABS: 150.0000 mg | ORAL_TABLET | Freq: Once | ORAL | Status: AC

## 2011-10-09 NOTE — Progress Notes (Signed)
  Subjective:    [name], female    DOB: 25-Jun-1980, 31 y.o.   MRN: [id number]  HPI DYSURIA Onset:  2 days  Description: dysuria, increased urinary frequency,  Modifying factors: none  Symptoms Urgency:  yes Frequency: yes  Hesitancy:  no Hematuria:  no Flank Pain:  no Fever: no Nausea/Vomiting:  no Missed LMP: no STD exposure: no Discharge: minimal  Irritants: no Rash: no Vaginal irritation: yes   Red Flags   More than 3 UTI's last 12 months:  no PMH of  Diabetes or Immunosuppression:  no Renal Disease/Calculi: no Urinary Tract Abnormality:  no Instrumentation or Trauma: no ------------------------------------------------------------------------------------------------- URI Symptoms Onset: 2-3 weeks  Description: bilateral ear fullness, nasal congestion, post nasal drip,  Modifying factors:  none  Symptoms Nasal discharge: yes Fever: no Sore throat: no Cough: no Wheezing: no Ear pain: ear fullness bilaterally  GI symptoms: no Sick contacts: no  Red Flags  Stiff neck: no Dyspnea: no Rash: no Swallowing difficulty: no  Sinusitis Risk Factors Headache/face pain: mild Double sickening: no tooth pain: no  Allergy Risk Factors Sneezing: yes Itchy scratchy throat: yes Seasonal symptoms: yes  Flu Risk Factors Headache: no muscle aches: no severe fatigue: no  Review of Systems See HPI, otherwise ROS negative     Objective:   Physical Exam Gen: up in chair, NAD HEENT: NCAT, EOMI, bilateral cerumen impaction, +nasal erythema, rhinorrhea bilaterally, + post oropharyngeal erythema  CV: RRR, no murmurs auscultated PULM: CTAB, no wheezes, rales, rhoncii ABD: S/+ bowel sounds/mild suprapubic tenderness EXT: 2+ peripheral pulses   Assessment & Plan:  Allergic Rhinitis: flonase and zyrtec. Discussed infectious red flags. Handout given. Follow up as needed.  Dysuria: Will clinically treat with keflex x 7 days. Discussed infectious  red flags. Handout given. Follow up as needed.  Vaginal irritation: Will treat with fluconazole.     The patient and/or caregiver has been counseled thoroughly with regard to treatment plan and/or medications prescribed including dosage, schedule, interactions, rationale for use, and possible side effects and they verbalize understanding. Diagnoses and expected course of recovery discussed and will return if not improved as expected or if the condition worsens. Patient and/or caregiver verbalized understanding.

## 2011-10-09 NOTE — Progress Notes (Signed)
  Subjective:    [name], female    DOB: [birth year], 31 y.o.   MRN: [id number]  HPI    Review of Systems     Objective:   Physical Exam        Assessment & Plan:  Reviewed.  Agree with assessment and plan.

## 2011-10-09 NOTE — Patient Instructions (Signed)
Allergic Rhinitis Allergic rhinitis is when the mucous membranes in the nose respond to allergens. Allergens are particles in the air that cause your body to have an allergic reaction. This causes you to release allergic antibodies. Through a chain of events, these eventually cause you to release histamine into the blood stream (hence the use of antihistamines). Although meant to be protective to the body, it is this release that causes your discomfort, such as frequent sneezing, congestion and an itchy runny nose.  CAUSES  The pollen allergens may come from grasses, trees, and weeds. This is seasonal allergic rhinitis, or "hay fever." Other allergens cause year-round allergic rhinitis (perennial allergic rhinitis) such as house dust mite allergen, pet dander and mold spores.  SYMPTOMS   Nasal stuffiness (congestion).   Runny, itchy nose with sneezing and tearing of the eyes.   There is often an itching of the mouth, eyes and ears.  It cannot be cured, but it can be controlled with medications. DIAGNOSIS  If you are unable to determine the offending allergen, skin or blood testing may find it. TREATMENT   Avoid the allergen.   Medications and allergy shots (immunotherapy) can help.   Hay fever may often be treated with antihistamines in pill or nasal spray forms. Antihistamines block the effects of histamine. There are over-the-counter medicines that may help with nasal congestion and swelling around the eyes. Check with your caregiver before taking or giving this medicine.  If the treatment above does not work, there are many new medications your caregiver can prescribe. Stronger medications may be used if initial measures are ineffective. Desensitizing injections can be used if medications and avoidance fails. Desensitization is when a patient is given ongoing shots until the body becomes less sensitive to the allergen. Make sure you follow up with your caregiver if problems continue. SEEK  MEDICAL CARE IF:   You develop fever (more than 100.5 F (38.1 C).   You develop a cough that does not stop easily (persistent).   You have shortness of breath.   You start wheezing.   Symptoms interfere with normal daily activities.  Document Released: 01/21/2001 Document Revised: 04/17/2011 Document Reviewed: 08/02/2008 [facility] Patient Information 2012 [place], [place].  Urinary Tract Infection Infections of the urinary tract can start in several places. A bladder infection (cystitis), a kidney infection (pyelonephritis), and a prostate infection (prostatitis) are different types of urinary tract infections (UTIs). They usually get better if treated with medicines (antibiotics) that kill germs. Take all the medicine until it is gone. You or your child may feel better in a few days, but TAKE ALL MEDICINE or the infection may not respond and may become more difficult to treat. HOME CARE INSTRUCTIONS   Drink enough water and fluids to keep the urine clear or pale yellow. Cranberry juice is especially recommended, in addition to large amounts of water.   Avoid caffeine, tea, and carbonated beverages. They tend to irritate the bladder.   Alcohol may irritate the prostate.   Only take over-the-counter or prescription medicines for pain, discomfort, or fever as directed by your caregiver.  To prevent further infections:  Empty the bladder often. Avoid holding urine for long periods of time.   After a bowel movement, women should cleanse from front to back. Use each tissue only once.   Empty the bladder before and after sexual intercourse.  FINDING OUT THE RESULTS OF YOUR TEST Not all test results are available during your visit. If your or your child's test  results are not back during the visit, make an appointment with your caregiver to find out the results. Do not assume everything is normal if you have not heard from your caregiver or the medical facility. It is important for you to  follow up on all test results. SEEK MEDICAL CARE IF:   There is back pain.   Your baby is older than 3 months with a rectal temperature of 100.5 F (38.1 C) or higher for more than 1 day.   Your or your child's problems (symptoms) are no better in 3 days. Return sooner if you or your child is getting worse.  SEEK IMMEDIATE MEDICAL CARE IF:   There is severe back pain or lower abdominal pain.   You or your child develops chills.   You have a fever.   Your baby is older than 3 months with a rectal temperature of 102 F (38.9 C) or higher.   Your baby is 89 months old or younger with a rectal temperature of 100.4 F (38 C) or higher.   There is nausea or vomiting.   There is continued burning or discomfort with urination.  MAKE SURE YOU:   Understand these instructions.   Will watch your condition.   Will get help right away if you are not doing well or get worse.  Document Released: 02/05/2005 Document Revised: 04/17/2011 Document Reviewed: 09/10/2006 [facility] Patient Information 2012 [place], [place].

## 2011-10-11 LAB — URINE CULTURE: Colony Count: 35000

## 2011-11-04 ENCOUNTER — Ambulatory Visit (INDEPENDENT_AMBULATORY_CARE_PROVIDER_SITE_OTHER): Payer: PRIVATE HEALTH INSURANCE | Admitting: Family Medicine

## 2011-11-04 VITALS — BP 127/76 | HR 69 | Temp 98.2°F | Resp 18 | Ht 62.0 in | Wt 227.0 lb

## 2011-11-04 DIAGNOSIS — M5430 Sciatica, unspecified side: Secondary | ICD-10-CM

## 2011-11-04 DIAGNOSIS — M549 Dorsalgia, unspecified: Secondary | ICD-10-CM

## 2011-11-04 MED ORDER — CYCLOBENZAPRINE HCL 10 MG PO TABS: 10.0000 mg | ORAL_TABLET | Freq: Three times a day (TID) | ORAL | Status: AC | PRN

## 2011-11-04 MED ORDER — PREDNISONE 20 MG PO TABS: ORAL_TABLET | ORAL | Status: AC

## 2011-11-04 MED ORDER — HYDROCODONE-ACETAMINOPHEN 5-500 MG PO TABS: 1.0000 | ORAL_TABLET | Freq: Three times a day (TID) | ORAL | Status: AC | PRN

## 2011-11-04 NOTE — Progress Notes (Signed)
    Patient Name: [name] Date of Birth: [birth year] Medical Record Number: [id number] Gender: female Date of Encounter: 11/04/2011  History of Present Illness:  LADELL BEY is a 31 y.o. very pleasant female patient who presents with the following:  Here to evaluate back pain. About 2 weeks ago she "lifted wrong" and "strained my lower back."  She saw her chiropractor who did an adjustment- she followed up twice with him - seemed to get better.  However, on Sunday evening her pain got a lot worse, and she did not sleep that night.  She now has pain down into her right buttock.  There was no recurrent injury.    She has never had this before.  She has tried icy- hot, heat and ice, and ibuprofen.   She feels tingly, "nerve- type" pain.  She does not note weakness or numbness, no incontinence.   LMP 10/27/11   Patient Active Problem List  Diagnosis  . MIGRAINE HEADACHE  . GASTROESOPHAGEAL REFLUX DISEASE, CHRONIC  . CONSTIPATION  . CARDIAC MURMUR  . ABDOMINAL PAIN  . IRRITABLE BOWEL SYNDROME, HX OF   No past medical history on file. No past surgical history on file. History  Substance Use Topics  . Smoking status: Never Smoker   . Smokeless tobacco: Never Used  . Alcohol Use: No   No family history on file. No Known Allergies  Medication list has been reviewed and updated.  Prior to Admission medications   Medication Sig Start Date End Date Taking? Authorizing Provider  cetirizine (ZYRTEC) 10 MG tablet Take 10 mg by mouth daily.   Yes Historical Provider, MD  fluticasone Aleda Grana) 50 MCG/ACT nasal spray 2 sprays into each nostril daily 10/09/11  Yes Floydene Flock, MD    Review of Systems:  As per HPI- otherwise negative.   Physical Examination: Filed Vitals:   11/04/11 1153  BP: 127/76  Pulse: 69  Temp: 98.2 F (36.8 C)  Resp: 18   Filed Vitals:   11/04/11 1153  Height: 5\' 2"  (1.575 m)  Weight: 227 lb (102.967 kg)   Body mass index is 41.52  kg/(m^2). Ideal Body Weight: Weight in (lb) to have BMI = 25: 136.4   GEN: WDWN, NAD, Non-toxic, A & O x 3, obese- uncomfortable with back pain HEENT: Atraumatic, Normocephalic. Neck supple. No masses, No LAD. Ears and Nose: No external deformity. CV: RRR, No M/G/R. No JVD. No thrill. No extra heart sounds. PULM: CTA B, no wheezes, crackles, rhonchi. No retractions. No resp. distress. No accessory muscle use. EXTR: No c/c/e NEURO Normal gait.  PSYCH: Normally interactive. Conversant. Not depressed or anxious appearing.  Calm demeanor.  Back: tenderness and pain right buttock, discomfort with straight leg raise right > left.  Sensation and strength normal, DTR ok.    Assessment and Plan: 1. Back pain    2. Sciatica  predniSONE (DELTASONE) 20 MG tablet, cyclobenzaprine (FLEXERIL) 10 MG tablet, HYDROcodone-acetaminophen (VICODIN) 5-500 MG per tablet  treat for sciatica and back pain as above.  Let me know if not better in 2 or 3 days.  Sooner if worse.   Gave note for her job- may be out for 2 or 3 days  Zsofia Prout, MD

## 2012-08-31 ENCOUNTER — Ambulatory Visit: Payer: PRIVATE HEALTH INSURANCE | Admitting: Nurse Practitioner

## 2012-09-06 ENCOUNTER — Encounter: Payer: Self-pay | Admitting: *Deleted

## 2012-09-06 DIAGNOSIS — F319 Bipolar disorder, unspecified: Secondary | ICD-10-CM

## 2012-09-06 DIAGNOSIS — E663 Overweight: Secondary | ICD-10-CM | POA: Insufficient documentation

## 2012-09-06 DIAGNOSIS — L700 Acne vulgaris: Secondary | ICD-10-CM

## 2012-09-06 HISTORY — DX: Bipolar disorder, unspecified: F31.9

## 2012-09-07 ENCOUNTER — Ambulatory Visit (INDEPENDENT_AMBULATORY_CARE_PROVIDER_SITE_OTHER): Payer: PRIVATE HEALTH INSURANCE | Admitting: Adult Health

## 2012-09-07 ENCOUNTER — Encounter: Payer: Self-pay | Admitting: Adult Health

## 2012-09-07 VITALS — BP 110/60 | Ht 61.0 in | Wt 244.0 lb

## 2012-09-07 DIAGNOSIS — N63 Unspecified lump in unspecified breast: Secondary | ICD-10-CM

## 2012-09-07 DIAGNOSIS — N6324 Unspecified lump in the left breast, lower inner quadrant: Secondary | ICD-10-CM | POA: Insufficient documentation

## 2012-09-07 HISTORY — DX: Unspecified lump in the left breast, lower inner quadrant: N63.24

## 2012-09-07 NOTE — Patient Instructions (Addendum)
Sign up for my chart Follow up 4 weeks for pap and physical Get mammogram 5/7 at 1pm be there at 12:45pm

## 2012-09-07 NOTE — Assessment & Plan Note (Signed)
Has mass at 7 o'clock left breast will get mammogram and [place] 5/7 at 1 pm at [facility].

## 2012-09-07 NOTE — Progress Notes (Signed)
Subjective:     [name], female   DOB: 1980-07-16, 32 y.o.   MRN: [id number]  HPI [name] is a 32 year old married white female in complaining of left breast lump.she found it about a week age when she was on her period, it is tender. She has no breast discharge.she is off her micronor since January secondary to bleeding, but she said she did not take on time.   Review of SystemsComplaints as above in HPI, and she has decrease libido too. Reviewed past medical,surgical, social and family history. Reviewed medications and allergies.     Objective:   Physical Exam Blood pressure 110/60, height 5\' 1"  (1.549 m), weight 244 lb (110.678 kg), last menstrual period 08/31/2012.   Skin warm and dry. Breast: No dominant mass, retraction or nipple discharge on the right, some irregularities UOQ, on the left there is a mass at 7 o'clock that is tender and 2 finger breaths from areola, no retraction or nipple discharge. Assessment:      Left breast mass at 7 o'clock    Plan:      Will get bilateral diagnostic mammogram and left breast US at [facility] on 5/7 at 1 pm. Be there at 12:45 pm.   Follow up here in 4 weeks for pap and physical.

## 2012-09-15 ENCOUNTER — Ambulatory Visit (HOSPITAL_COMMUNITY)
Admission: RE | Admit: 2012-09-15 | Discharge: 2012-09-15 | Disposition: A | Discharge status: Home / Self Care | Payer: No Typology Code available for payment source | Source: Ambulatory Visit | Attending: Adult Health | Admitting: Adult Health

## 2012-09-15 DIAGNOSIS — N6324 Unspecified lump in the left breast, lower inner quadrant: Secondary | ICD-10-CM

## 2012-09-15 DIAGNOSIS — N63 Unspecified lump in unspecified breast: Secondary | ICD-10-CM | POA: Insufficient documentation

## 2012-09-23 ENCOUNTER — Ambulatory Visit: Payer: PRIVATE HEALTH INSURANCE | Admitting: Nurse Practitioner

## 2012-10-06 ENCOUNTER — Other Ambulatory Visit: Payer: PRIVATE HEALTH INSURANCE | Admitting: Adult Health

## 2012-12-21 ENCOUNTER — Encounter: Payer: Self-pay | Admitting: Adult Health

## 2012-12-21 ENCOUNTER — Other Ambulatory Visit (HOSPITAL_COMMUNITY)
Admission: RE | Admit: 2012-12-21 | Discharge: 2012-12-21 | Disposition: A | Discharge status: Home / Self Care | Payer: No Typology Code available for payment source | Source: Ambulatory Visit | Attending: Adult Health | Admitting: Adult Health

## 2012-12-21 ENCOUNTER — Ambulatory Visit (INDEPENDENT_AMBULATORY_CARE_PROVIDER_SITE_OTHER): Payer: 59 | Admitting: Adult Health

## 2012-12-21 VITALS — BP 120/80 | HR 76 | Ht 61.0 in | Wt 249.0 lb

## 2012-12-21 DIAGNOSIS — Z01419 Encounter for gynecological examination (general) (routine) without abnormal findings: Secondary | ICD-10-CM | POA: Insufficient documentation

## 2012-12-21 DIAGNOSIS — E669 Obesity, unspecified: Secondary | ICD-10-CM

## 2012-12-21 DIAGNOSIS — E282 Polycystic ovarian syndrome: Secondary | ICD-10-CM | POA: Insufficient documentation

## 2012-12-21 DIAGNOSIS — Z1151 Encounter for screening for human papillomavirus (HPV): Secondary | ICD-10-CM | POA: Insufficient documentation

## 2012-12-21 HISTORY — DX: Polycystic ovarian syndrome: E28.2

## 2012-12-21 MED ORDER — ESCITALOPRAM OXALATE 20 MG PO TABS: 20.0000 mg | ORAL_TABLET | Freq: Every day | ORAL | Status: DC

## 2012-12-21 NOTE — Patient Instructions (Addendum)
Calorie Counting Diet A calorie counting diet requires you to eat the number of calories that are right for you in a day. Calories are the measurement of how much energy you get from the food you eat. Eating the right amount of calories is important for staying at a healthy weight. If you eat too many calories, your body will store them as fat and you may gain weight. If you eat too few calories, you may lose weight. Counting the number of calories you eat during a day will help you know if you are eating the right amount. A Registered Dietitian can determine how many calories you need in a day. The amount of calories needed varies from person to person. If your goal is to lose weight, you will need to eat fewer calories. Losing weight can benefit you if you are overweight or have health problems such as heart disease, high blood pressure, or diabetes. If your goal is to gain weight, you will need to eat more calories. Gaining weight may be necessary if you have a certain health problem that causes your body to need more energy. TIPS Whether you are increasing or decreasing the number of calories you eat during a day, it may be hard to get used to changes in what you eat and drink. The following are tips to help you keep track of the number of calories you eat.  Measure foods at home with measuring cups. This helps you know the amount of food and number of calories you are eating.  Restaurants often serve food in amounts that are larger than 1 serving. While eating out, estimate how many servings of a food you are given. For example, a serving of cooked rice is  cup or about the size of half of a fist. Knowing serving sizes will help you be aware of how much food you are eating at restaurants.  Ask for smaller portion sizes or child-size portions at restaurants.  Plan to eat half of a meal at a restaurant. Take the rest home or share the other half with a friend.  Read the Nutrition Facts panel on  food labels for calorie content and serving size. You can find out how many servings are in a package, the size of a serving, and the number of calories each serving has.  For example, a package might contain 3 cookies. The Nutrition Facts panel on that package says that 1 serving is 1 cookie. Below that, it will say there are 3 servings in the container. The calories section of the Nutrition Facts label says there are 90 calories. This means there are 90 calories in 1 cookie (1 serving). If you eat 1 cookie you have eaten 90 calories. If you eat all 3 cookies, you have eaten 270 calories (3 servings x 90 calories = 270 calories). The list below tells you how big or small some common portion sizes are.  1 oz.........4 stacked dice.  3 oz.........Deck of cards.  1 tsp........Tip of little finger.  1 tbs........Thumb.  2 tbs........Golf ball.   cup.......Half of a fist.  1 cup........A fist. KEEP A FOOD LOG Write down every food item you eat, the amount you eat, and the number of calories in each food you eat during the day. At the end of the day, you can add up the total number of calories you have eaten. It may help to keep a list like the one below. Find out the calorie information by reading the   Nutrition Facts panel on food labels. Breakfast  Bran cereal (1 cup, 110 calories).  Fat-free milk ( cup, 45 calories). Snack  Apple (1 medium, 80 calories). Lunch  Spinach (1 cup, 20 calories).  Tomato ( medium, 20 calories).  Chicken breast strips (3 oz, 165 calories).  Shredded cheddar cheese ( cup, 110 calories).  Light [place] dressing (2 tbs, 60 calories).  Whole-wheat bread (1 slice, 80 calories).  Tub margarine (1 tsp, 35 calories).  Vegetable soup (1 cup, 160 calories). Dinner  Pork chop (3 oz, 190 calories).  Brown rice (1 cup, 215 calories).  Steamed broccoli ( cup, 20 calories).  Strawberries (1  cup, 65 calories).  Whipped cream (1 tbs, 50  calories). Daily Calorie Total: 1425 Document Released: 04/28/2005 Document Revised: 07/21/2011 Document Reviewed: 10/23/2006 [facility] Patient Information 2014 [place], [place]. Physical in 1 year Follow up labs

## 2012-12-21 NOTE — Progress Notes (Signed)
[name], female   DOB: October 29, 1980, 32 y.o.   MRN: [id number] History of Present Illness:  [name] is a 32 year old white female married in for a pap and physical.  Current Medications, Allergies, Past Medical History, Past Surgical History, Family History and Social History were reviewed in [employer] electronic medical record.     Review of Systems: Patient denies any headaches, blurred vision, shortness of breath, chest pain, abdominal pain, problems with bowel movements, urination, or intercourse. No joint pain or mood changes.She does complain of not being able to lose weight, she is eating right and even has a trainer.She has a new job with [employer] in [place].    Physical Exam:BP 120/80  Pulse 76  Ht 5\' 1"  (1.549 m)  Wt 249 lb (112.946 kg)  BMI 47.07 kg/m2  LMP 12/06/2012 General:  Well developed, well nourished, no acute distress Skin:  Warm and dry Neck:  Midline trachea, normal thyroid Lungs; Clear to auscultation bilaterally Breast:  No dominant palpable mass, retraction, or nipple discharge Cardiovascular: Regular rate and rhythm Abdomen:  Soft, non tender, no hepatosplenomegaly Pelvic:  External genitalia is normal in appearance.  The vagina is normal in appearance.  The cervix is nullepareous. Pap performed with HPV. Uterus is felt to be normal size, shape, and contour.  No                adnexal masses or tenderness noted. Extremities:  No swelling or varicosities noted Psych:  Alert and cooperative, seems happy Discussed cutting carbs out and continue with the trainer, will check labs, discussed possible use of metformin or belviq.  Impression: Yearly gyn exam History of PCOS  Obesity   Plan: Check CBC,CMP,TSH Refilled lexapro 20 mg #30 1 daily with 11 refills Physical in 1 year

## 2012-12-22 ENCOUNTER — Telehealth: Payer: Self-pay | Admitting: Adult Health

## 2012-12-22 LAB — COMPREHENSIVE METABOLIC PANEL
ALT: 18 U/L (ref 0–35)
AST: 17 U/L (ref 0–37)
Albumin: 4.4 g/dL (ref 3.5–5.2)
Alkaline Phosphatase: 78 U/L (ref 39–117)
BUN: 12 mg/dL (ref 6–23)
CO2: 29 mEq/L (ref 19–32)
Calcium: 9.5 mg/dL (ref 8.4–10.5)
Chloride: 100 mEq/L (ref 96–112)
Creat: 0.76 mg/dL (ref 0.50–1.10)
Glucose, Bld: 90 mg/dL (ref 70–99)
Potassium: 4.7 mEq/L (ref 3.5–5.3)
Sodium: 136 mEq/L (ref 135–145)
Total Bilirubin: 0.4 mg/dL (ref 0.3–1.2)
Total Protein: 7.3 g/dL (ref 6.0–8.3)

## 2012-12-22 LAB — CBC
HCT: 41.9 % (ref 36.0–46.0)
Hemoglobin: 13.8 g/dL (ref 12.0–15.0)
MCH: 29.6 pg (ref 26.0–34.0)
MCHC: 32.9 g/dL (ref 30.0–36.0)
MCV: 89.7 fL (ref 78.0–100.0)
Platelets: 343 10*3/uL (ref 150–400)
RBC: 4.67 MIL/uL (ref 3.87–5.11)
RDW: 13 % (ref 11.5–15.5)
WBC: 8.3 10*3/uL (ref 4.0–10.5)

## 2012-12-22 LAB — TSH: TSH: 1.415 u[IU]/mL (ref 0.350–4.500)

## 2012-12-22 NOTE — Telephone Encounter (Signed)
Pt aware labs normal  

## 2013-01-04 ENCOUNTER — Other Ambulatory Visit: Payer: Self-pay | Admitting: *Deleted

## 2013-01-04 MED ORDER — ESCITALOPRAM OXALATE 20 MG PO TABS: 20.0000 mg | ORAL_TABLET | Freq: Every day | ORAL | Status: DC

## 2013-01-11 ENCOUNTER — Other Ambulatory Visit: Payer: Self-pay | Admitting: *Deleted

## 2013-03-17 ENCOUNTER — Other Ambulatory Visit: Payer: Self-pay

## 2013-04-13 ENCOUNTER — Telehealth: Payer: Self-pay | Admitting: Adult Health

## 2013-04-13 NOTE — Telephone Encounter (Signed)
Pt states insurance will no longer cover generic form of Lexapro, can she bring form by for provider to fill out and fax. Pt informed to bring form at her convenience and review with Cyril Mourning, NP.

## 2013-05-17 ENCOUNTER — Encounter: Payer: Self-pay | Admitting: Adult Health

## 2013-05-17 ENCOUNTER — Ambulatory Visit: Payer: Self-pay | Admitting: Adult Health

## 2013-05-17 ENCOUNTER — Ambulatory Visit (INDEPENDENT_AMBULATORY_CARE_PROVIDER_SITE_OTHER): Payer: 59 | Admitting: Adult Health

## 2013-05-17 VITALS — BP 120/76 | Ht 61.0 in | Wt 254.0 lb

## 2013-05-17 DIAGNOSIS — B373 Candidiasis of vulva and vagina: Secondary | ICD-10-CM

## 2013-05-17 DIAGNOSIS — N898 Other specified noninflammatory disorders of vagina: Secondary | ICD-10-CM

## 2013-05-17 DIAGNOSIS — B3731 Acute candidiasis of vulva and vagina: Secondary | ICD-10-CM

## 2013-05-17 HISTORY — DX: Other specified noninflammatory disorders of vagina: N89.8

## 2013-05-17 HISTORY — DX: Candidiasis of vulva and vagina: B37.3

## 2013-05-17 HISTORY — DX: Acute candidiasis of vulva and vagina: B37.31

## 2013-05-17 LAB — POCT WET PREP (WET MOUNT)

## 2013-05-17 MED ORDER — FLUCONAZOLE 150 MG PO TABS: ORAL_TABLET | ORAL | Status: DC

## 2013-05-17 MED ORDER — NYSTATIN-TRIAMCINOLONE 100000-0.1 UNIT/GM-% EX OINT: 1.0000 "application " | TOPICAL_OINTMENT | Freq: Two times a day (BID) | CUTANEOUS | Status: DC

## 2013-05-17 NOTE — Progress Notes (Signed)
Subjective:     Patient ID: Sue HerbMeredith Butler, female   DOB: February 23, 1981, 33 y.o.   MRN: [id number]  HPI Sue NimrodMeredith is a 33 year old white female,married in complaining of a vaginal discharge and itch, no new soaps or clothes, or meds.Says itch more on labia.  Review of Systems See HPI Reviewed past medical,surgical, social and family history. Reviewed medications and allergies.     Objective:   Physical Exam BP 120/76  Ht 5\' 1"  (1.549 m)  Wt 254 lb (115.214 kg)  BMI 48.02 kg/m2  LMP 05/29/2012   Skin warm and dry.Pelvic: external genitalia is normal in appearance, does have small dermal cyst left labia,vagina: white discharge without odor, cervix:smooth, uterus: normal size, shape and contour, non tender, no masses felt, adnexa: no masses or tenderness noted. Wet prep: + for yeast   Assessment:     Vaginal discharge Yeast     Plan:    Rx mytrex oint to use bid prn with 1 refill Follow up prn  Review handout on yeast vaginitis Rx diflucan 150 mg #2 take 1 now and 1 in 3 days with 2 refills

## 2013-05-17 NOTE — Patient Instructions (Signed)
Monilial Vaginitis Vaginitis in a soreness, swelling and redness (inflammation) of the vagina and vulva. Monilial vaginitis is not a sexually transmitted infection. CAUSES  Yeast vaginitis is caused by yeast (candida) that is normally found in your vagina. With a yeast infection, the candida has overgrown in number to a point that upsets the chemical balance. SYMPTOMS   White, thick vaginal discharge.  Swelling, itching, redness and irritation of the vagina and possibly the lips of the vagina (vulva).  Burning or painful urination.  Painful intercourse. DIAGNOSIS  Things that may contribute to monilial vaginitis are:  Postmenopausal and virginal states.  Pregnancy.  Infections.  Being tired, sick or stressed, especially if you had monilial vaginitis in the past.  Diabetes. Good control will help lower the chance.  Birth control pills.  Tight fitting garments.  Using bubble bath, feminine sprays, douches or deodorant tampons.  Taking certain medications that kill germs (antibiotics).  Sporadic recurrence can occur if you become ill. TREATMENT  Your caregiver will give you medication.  There are several kinds of anti monilial vaginal creams and suppositories specific for monilial vaginitis. For recurrent yeast infections, use a suppository or cream in the vagina 2 times a week, or as directed.  Anti-monilial or steroid cream for the itching or irritation of the vulva may also be used. Get your caregiver's permission.  Painting the vagina with methylene blue solution may help if the monilial cream does not work.  Eating yogurt may help prevent monilial vaginitis. HOME CARE INSTRUCTIONS   Finish all medication as prescribed.  Do not have sex until treatment is completed or after your caregiver tells you it is okay.  Take warm sitz baths.  Do not douche.  Do not use tampons, especially scented ones.  Wear cotton underwear.  Avoid tight pants and panty  hose.  Tell your sexual partner that you have a yeast infection. They should go to their caregiver if they have symptoms such as mild rash or itching.  Your sexual partner should be treated as well if your infection is difficult to eliminate.  Practice safer sex. Use condoms.  Some vaginal medications cause latex condoms to fail. Vaginal medications that harm condoms are:  Cleocin cream.  Butoconazole (Femstat).  Terconazole (Terazol) vaginal suppository.  Miconazole (Monistat) (may be purchased over the counter). SEEK MEDICAL CARE IF:   You have a temperature by mouth above 102 F (38.9 C).  The infection is getting worse after 2 days of treatment.  The infection is not getting better after 3 days of treatment.  You develop blisters in or around your vagina.  You develop vaginal bleeding, and it is not your menstrual period.  You have pain when you urinate.  You develop intestinal problems.  You have pain with sexual intercourse. Document Released: 02/05/2005 Document Revised: 07/21/2011 Document Reviewed: 10/20/2008 [employer] Patient Information 2014 [employer]. Follow up prn  

## 2013-05-26 HISTORY — PX: BUNIONECTOMY: SHX129

## 2014-01-31 ENCOUNTER — Ambulatory Visit (INDEPENDENT_AMBULATORY_CARE_PROVIDER_SITE_OTHER): Payer: 59 | Admitting: Adult Health

## 2014-01-31 ENCOUNTER — Encounter: Payer: Self-pay | Admitting: Adult Health

## 2014-01-31 VITALS — BP 140/78 | HR 78 | Ht 62.0 in | Wt 264.0 lb

## 2014-01-31 DIAGNOSIS — Z3202 Encounter for pregnancy test, result negative: Secondary | ICD-10-CM

## 2014-01-31 DIAGNOSIS — Z01419 Encounter for gynecological examination (general) (routine) without abnormal findings: Secondary | ICD-10-CM

## 2014-01-31 DIAGNOSIS — Z319 Encounter for procreative management, unspecified: Secondary | ICD-10-CM

## 2014-01-31 DIAGNOSIS — F419 Anxiety disorder, unspecified: Secondary | ICD-10-CM

## 2014-01-31 DIAGNOSIS — N926 Irregular menstruation, unspecified: Secondary | ICD-10-CM

## 2014-01-31 HISTORY — DX: Encounter for procreative management, unspecified: Z31.9

## 2014-01-31 HISTORY — DX: Anxiety disorder, unspecified: F41.9

## 2014-01-31 HISTORY — DX: Irregular menstruation, unspecified: N92.6

## 2014-01-31 LAB — POCT URINE PREGNANCY: Preg Test, Ur: NEGATIVE

## 2014-01-31 MED ORDER — BUSPIRONE HCL 7.5 MG PO TABS: ORAL_TABLET | ORAL | Status: DC

## 2014-01-31 MED ORDER — PRENATAL PLUS 27-1 MG PO TABS: 1.0000 | ORAL_TABLET | Freq: Every day | ORAL | Status: DC

## 2014-01-31 MED ORDER — ESCITALOPRAM OXALATE 20 MG PO TABS: 20.0000 mg | ORAL_TABLET | Freq: Every day | ORAL | Status: DC

## 2014-01-31 NOTE — Patient Instructions (Signed)
Preparing for Pregnancy Before trying to become pregnant, make an appointment with your health care provider (preconception care). The goal is to help you have a healthy, safe pregnancy. At your first appointment, your health care provider will:   Do a complete physical exam, including a Pap test.  Take a complete medical history.  Give you advice and help you resolve any problems. PRECONCEPTION CHECKLIST Here is a list of the basics to cover with your health care provider at your preconception visit:  Medical history.  Tell your health care provider about any diseases you have had. Many diseases can affect your pregnancy.  Include your partner's medical history and family history.  Make sure you have been tested for sexually transmitted infections (STIs). These can affect your pregnancy. In some cases, they can be passed to your baby. Tell your health care provider about any history of STIs.  Make sure your health care provider knows about any previous problems you have had with conception or pregnancy.  Tell your health care provider about any medicine you take. This includes herbal supplements and over-the-counter medicines.  Make sure all your immunizations are up to date. You may need to make additional appointments.  Ask your health care provider if you need any vaccinations or if there are any you should avoid.  Diet.  It is especially important to eat a healthy, balanced diet with the right nutrients when you are pregnant.  Ask your health care provider to help you get to a healthy weight before pregnancy.  If you are overweight, you are at higher risk for certain complications. These include high blood pressure, diabetes, and preterm birth.  If you are underweight, you are more likely to have a low-birth-weight baby.  Lifestyle.  Tell your health care provider about lifestyle factors such as alcohol use, drug use, or smoking.  Describe any harmful substances you may  be exposed to at work or home. These can include chemicals, pesticides, and radiation.  Mental health.  Let your health care provider know if you have been feeling depressed or anxious.  Let your health care provider know if you have a history of substance abuse.  Let your health care provider know if you do not feel safe at home. HOME INSTRUCTIONS TO PREPARE FOR PREGNANCY Follow your health care provider's advice and instructions.   Keep an accurate record of your menstrual periods. This makes it easier for your health care provider to determine your baby's due date.  Begin taking prenatal vitamins and folic acid supplements daily. Take them as directed by your health care provider.  Eat a balanced diet. Get help from a nutrition counselor if you have questions or need help.  Get regular exercise. Try to be active for at least 30 minutes a day most days of the week.  Quit smoking, if you smoke.  Do not drink alcohol.  Do not take illegal drugs.  Get medical problems, such as diabetes or high blood pressure, under control.  If you have diabetes, make sure you do the following:  Have good blood sugar control. If you have type 1 diabetes, use multiple daily doses of insulin. Do not use split-dose or premixed insulin.  Have an eye exam by a qualified eye care professional trained in caring for people with diabetes.  Get evaluated by your health care provider for cardiovascular disease.  Get to a healthy weight. If you are overweight or obese, reduce your weight with the help of a qualified health   professional such as a Museum/gallery exhibitions officer. Ask your health care provider what the right weight range is for you. HOW DO I KNOW I AM PREGNANT? You may be pregnant if you have been sexually active and you miss your period. Symptoms of early pregnancy include:   Mild cramping.  Very light vaginal bleeding (spotting).  Feeling unusually tired.  Morning sickness. If you have any of  these symptoms, take a home pregnancy test. These tests look for a hormone called human chorionic gonadotropin (hCG) in your urine. Your body begins to make this hormone during early pregnancy. These tests are very accurate. Wait until at least the first day you miss your period to take one. If you get a positive result, call your health care provider to make appointments for prenatal care. WHAT SHOULD I DO IF I BECOME PREGNANT?  Make an appointment with your health care provider by week 12 of your pregnancy at the latest.  Do not smoke. Smoking can be harmful to your baby.  Do not drink alcoholic beverages. Alcohol is related to a number of birth defects.  Avoid toxic odors and chemicals.  You may continue to have sexual intercourse if it does not cause pain or other problems, such as vaginal bleeding. Document Released: 04/10/2008 Document Revised: 09/12/2013 Document Reviewed: 04/04/2013 [facility] Patient Information 2015 [place], [place]. This information is not intended to replace advice given to you by your health care provider. Make sure you discuss any questions you have with your health care provider. Get husband checked Take buspar 7.5 mg 1 bid and start prenatals Call in follow up  Physical in 1 year

## 2014-01-31 NOTE — Progress Notes (Signed)
[name], female   DOB: 1981-03-20, 33 y.o.   MRN: [id number] History of Present Illness: Minola is a 33 year old white female, married, in for a gyn physical.She had a normal pap with negative HPV 12/21/12.She is having increased anxiety and irregular periods and wants to get pregnant. She is working out 4 days a week and watching her diet.  Current Medications, Allergies, Past Medical History, Past Surgical History, Family History and Social History were reviewed in [employer] record.     Review of Systems: Patient denies any headaches, blurred vision, shortness of breath, chest pain, abdominal pain, problems with bowel movements, urination, or intercourse. No joint pain.See HPI.    Physical Exam:BP 140/78  Pulse 78  Ht  (1.575 m)  Wt 264 lb (119.75 kg)  BMI 48.27 kg/m2UPT negative. General:  Well developed, well nourished, no acute distress Skin:  Warm and dry Neck:  Midline trachea, normal thyroid Lungs; Clear to auscultation bilaterally Breast:  No dominant palpable mass, retraction, or nipple discharge Cardiovascular: Regular rate and rhythm Abdomen:  Soft, non tender, no hepatosplenomegaly Pelvic:  External genitalia is normal in appearance.  The vagina is normal in appearance.  The cervix is smooth.  Uterus is felt to be normal size, shape, and contour.  No  adnexal masses or tenderness noted. Extremities:  No swelling or varicosities noted Psych: Alert and cooperative,seems happy Discussed trying clomid and she wants to, but she mentioned her husband has varicose vein in leg and his doctor said he needed to see urologists, may have in scrotum, sp I gave her the number for [facility] an when he gets checked may want to get semen analysis too,and get thise results before starting Clomid, and she agrees.  Impression: Yearly gyn exam no pap Irregular periods Desires pregnancy Anxiety    Plan: Rx Buspar 7.5 mg #60 1 bid  with 6 refills Continue lexapro 20 mg 1 daily Rx prenatal plus #30 1 daily with 11 refills Review handout on preparing for pregnancy Physical in 1 year Call me when results back from husband and wants to start clomid.

## 2014-02-24 ENCOUNTER — Other Ambulatory Visit: Payer: Self-pay

## 2014-06-04 ENCOUNTER — Other Ambulatory Visit: Payer: Self-pay | Admitting: Adult Health

## 2015-02-07 ENCOUNTER — Other Ambulatory Visit: Payer: Self-pay | Admitting: Adult Health

## 2015-02-21 ENCOUNTER — Encounter: Payer: Self-pay | Admitting: Adult Health

## 2015-02-21 ENCOUNTER — Ambulatory Visit (INDEPENDENT_AMBULATORY_CARE_PROVIDER_SITE_OTHER): Payer: 59 | Admitting: Adult Health

## 2015-02-21 VITALS — BP 120/80 | HR 80 | Ht 62.0 in | Wt 252.5 lb

## 2015-02-21 DIAGNOSIS — Z01419 Encounter for gynecological examination (general) (routine) without abnormal findings: Secondary | ICD-10-CM | POA: Diagnosis not present

## 2015-02-21 DIAGNOSIS — L689 Hypertrichosis, unspecified: Secondary | ICD-10-CM | POA: Insufficient documentation

## 2015-02-21 DIAGNOSIS — N920 Excessive and frequent menstruation with regular cycle: Secondary | ICD-10-CM

## 2015-02-21 HISTORY — DX: Hypertrichosis, unspecified: L68.9

## 2015-02-21 HISTORY — DX: Excessive and frequent menstruation with regular cycle: N92.0

## 2015-02-21 MED ORDER — SPIRONOLACTONE 25 MG PO TABS: 25.0000 mg | ORAL_TABLET | Freq: Every day | ORAL | Status: DC

## 2015-02-21 NOTE — Progress Notes (Signed)
[name], female   DOB: 1980-09-07, 34 y.o.   MRN: [id number] History of Present Illness: [name] is a 34 year old white female,married in for well woman gyn exam, her last pap was 12/21/12 and was normal with negative HPV.She is having some hot flashes and increased facial and breast hair and periods are longer and heavier, has ?boils between thighs at times.Husband has not gotten semen analysis yet, as she would like to be pregnant.   Current Medications, Allergies, Past Medical History, Past Surgical History, Family History and Social History were reviewed in [facility] electronic medical record.     Review of Systems: Patient denies any headaches, hearing loss, fatigue, blurred vision, shortness of breath, chest pain, abdominal pain, problems with bowel movements, urination, or intercourse. No joint pain or mood swings.See HPI for positives.    Physical Exam:BP 120/80 mmHg  Pulse 80  Ht 5\' 2"  (1.575 m)  Wt 252 lb 8 oz (114.533 kg)  BMI 46.17 kg/m2  LMP 02/12/2015 General:  Well developed, well nourished, no acute distress Skin:  Warm and dry Neck:  Midline trachea, normal thyroid, good ROM, no lymphadenopathy Lungs; Clear to auscultation bilaterally Breast:  No dominant palpable mass, retraction, or nipple discharge,increased hair Cardiovascular: Regular rate and rhythm Abdomen:  Soft, non tender, no hepatosplenomegaly Pelvic:  External genitalia is normal in appearance, no lesions.  The vagina is normal in appearance. Urethra has no lesions or masses. The cervix is smooth.  Uterus is felt to be normal size, shape, and contour.  No adnexal masses or tenderness noted.Bladder is non tender, no masses felt. Extremities/musculoskeletal:  No swelling or varicosities noted, no clubbing or cyanosis Psych:  No mood changes, alert and cooperative,seems happy Discussed with Dr Emelda FearFerguson will add spironolactone    Impression: Well woman gyn exam no pap Increased hair  on face and breasts Menorrhagia     Plan: Rx spironolactone 25 mg #30 take 1 daily with 6 refills Check CBC,CMP,TSH and lipids,A1c and testeosterone Get gyn US in 1 week Pap and physical in 1 year

## 2015-02-21 NOTE — Patient Instructions (Signed)
Return in 1 week for US Pap and physical in 1 year

## 2015-02-23 LAB — COMPREHENSIVE METABOLIC PANEL
ALT: 21 IU/L (ref 0–32)
AST: 16 IU/L (ref 0–40)
Albumin/Globulin Ratio: 1.7 (ref 1.1–2.5)
Albumin: 4.5 g/dL (ref 3.5–5.5)
Alkaline Phosphatase: 94 IU/L (ref 39–117)
BUN/Creatinine Ratio: 18 (ref 8–20)
BUN: 13 mg/dL (ref 6–20)
Bilirubin Total: 0.2 mg/dL (ref 0.0–1.2)
CO2: 23 mmol/L (ref 18–29)
Calcium: 9.3 mg/dL (ref 8.7–10.2)
Chloride: 100 mmol/L (ref 97–108)
Creatinine, Ser: 0.74 mg/dL (ref 0.57–1.00)
GFR calc Af Amer: 122 mL/min/{1.73_m2} (ref >59)
GFR calc non Af Amer: 106 mL/min/{1.73_m2} (ref >59)
Globulin, Total: 2.7 g/dL (ref 1.5–4.5)
Glucose: 105 mg/dL — ABNORMAL HIGH (ref 65–99)
Potassium: 5 mmol/L (ref 3.5–5.2)
Sodium: 139 mmol/L (ref 134–144)
Total Protein: 7.2 g/dL (ref 6.0–8.5)

## 2015-02-23 LAB — TESTOSTERONE, FREE, TOTAL, SHBG
SEX HORMONE BINDING: 49.1 nmol/L (ref 24.6–122.0)
TESTOSTERONE FREE: 3.9 pg/mL (ref 0.0–4.2)
TESTOSTERONE: 59 ng/dL — AB (ref 8–48)

## 2015-02-23 LAB — LIPID PANEL
CHOLESTEROL TOTAL: 160 mg/dL (ref 100–199)
Chol/HDL Ratio: 3.6 ratio units (ref 0.0–4.4)
HDL: 45 mg/dL (ref >39)
LDL Calculated: 100 mg/dL — ABNORMAL HIGH (ref 0–99)
Triglycerides: 73 mg/dL (ref 0–149)
VLDL CHOLESTEROL CAL: 15 mg/dL (ref 5–40)

## 2015-02-23 LAB — CBC
Hematocrit: 42.1 % (ref 34.0–46.6)
Hemoglobin: 13.7 g/dL (ref 11.1–15.9)
MCH: 29.3 pg (ref 26.6–33.0)
MCHC: 32.5 g/dL (ref 31.5–35.7)
MCV: 90 fL (ref 79–97)
Platelets: 381 10*3/uL — ABNORMAL HIGH (ref 150–379)
RBC: 4.67 x10E6/uL (ref 3.77–5.28)
RDW: 13.1 % (ref 12.3–15.4)
WBC: 6.7 10*3/uL (ref 3.4–10.8)

## 2015-02-23 LAB — TSH: TSH: 1.03 u[IU]/mL (ref 0.450–4.500)

## 2015-02-23 LAB — HEMOGLOBIN A1C
ESTIMATED AVERAGE GLUCOSE: 123 mg/dL
HEMOGLOBIN A1C: 5.9 % — AB (ref 4.8–5.6)

## 2015-02-27 ENCOUNTER — Telehealth: Payer: Self-pay | Admitting: Adult Health

## 2015-02-27 NOTE — Telephone Encounter (Signed)
Left message about labs, keep appt, 10/20 for [place]

## 2015-03-01 ENCOUNTER — Ambulatory Visit (INDEPENDENT_AMBULATORY_CARE_PROVIDER_SITE_OTHER): Payer: 59

## 2015-03-01 DIAGNOSIS — N920 Excessive and frequent menstruation with regular cycle: Secondary | ICD-10-CM

## 2015-03-01 NOTE — Progress Notes (Signed)
PELVIC US TA/TV: normal anteverted uterus,simple dominate follicle rt ov 2.2 x 2 x 1.5cm,normal lt ov,(mobile) ovaries are best seen on the TA images,no free fluid,EEC 11.52mm

## 2015-03-05 ENCOUNTER — Telehealth: Payer: Self-pay | Admitting: Adult Health

## 2015-03-05 NOTE — Telephone Encounter (Signed)
Pt aware US normal  

## 2015-12-10 ENCOUNTER — Other Ambulatory Visit: Payer: Self-pay | Admitting: *Deleted

## 2015-12-10 MED ORDER — SPIRONOLACTONE 25 MG PO TABS: 25.0000 mg | ORAL_TABLET | Freq: Every day | ORAL | 4 refills | Status: DC

## 2016-11-17 ENCOUNTER — Ambulatory Visit (INDEPENDENT_AMBULATORY_CARE_PROVIDER_SITE_OTHER): Payer: 59 | Admitting: Adult Health

## 2016-11-17 ENCOUNTER — Encounter: Payer: Self-pay | Admitting: Adult Health

## 2016-11-17 ENCOUNTER — Other Ambulatory Visit (HOSPITAL_COMMUNITY)
Admission: RE | Admit: 2016-11-17 | Discharge: 2016-11-17 | Disposition: A | Discharge status: Home / Self Care | Payer: 59 | Source: Ambulatory Visit | Attending: Adult Health | Admitting: Adult Health

## 2016-11-17 VITALS — BP 132/80 | HR 91 | Ht 62.0 in | Wt 237.0 lb

## 2016-11-17 DIAGNOSIS — N946 Dysmenorrhea, unspecified: Secondary | ICD-10-CM | POA: Insufficient documentation

## 2016-11-17 DIAGNOSIS — N898 Other specified noninflammatory disorders of vagina: Secondary | ICD-10-CM | POA: Insufficient documentation

## 2016-11-17 DIAGNOSIS — Z319 Encounter for procreative management, unspecified: Secondary | ICD-10-CM

## 2016-11-17 DIAGNOSIS — Z01419 Encounter for gynecological examination (general) (routine) without abnormal findings: Secondary | ICD-10-CM | POA: Insufficient documentation

## 2016-11-17 DIAGNOSIS — N920 Excessive and frequent menstruation with regular cycle: Secondary | ICD-10-CM

## 2016-11-17 DIAGNOSIS — L689 Hypertrichosis, unspecified: Secondary | ICD-10-CM

## 2016-11-17 MED ORDER — FLUCONAZOLE 150 MG PO TABS: ORAL_TABLET | ORAL | 6 refills | Status: DC

## 2016-11-17 NOTE — Progress Notes (Signed)
[name], female   DOB: 16-Jul-1980, 36 y.o.   MRN: [id number] History of Present Illness: [name] is a 36 year old white female, married in for well woman gyn exam and pap.She is having heavy periods,lasts 7 days, and increased period cramps,and vaginal itching before period. Sees Dr Orvan FalconerBeavers about facial hair now and other derm problems. PCP is Dr Margo AyeHall.    Current Medications, Allergies, Past Medical History, Past Surgical History, Family History and Social History were reviewed in [facility] electronic medical record.     Review of Systems: Patient denies any headaches, hearing loss, fatigue, blurred vision, shortness of breath, chest pain, abdominal pain, problems with bowel movements, urination, or intercourse. No joint pain or mood swings.See HPI for positives.    Physical Exam:BP 132/80 (BP Location: Right Arm, Patient Position: Sitting, Cuff Size: Normal)   Pulse 91   Ht 5\' 2"  (1.575 m)   Wt 237 lb (107.5 kg)   LMP 10/22/2016 (Exact Date)   BMI 43.35 kg/m  General:  Well developed, well nourished, no acute distress Skin:  Warm and dry Neck:  Midline trachea, normal thyroid, good ROM, no lymphadenopathy Lungs; Clear to auscultation bilaterally Breast:  No dominant palpable mass, retraction, or nipple discharge Cardiovascular: Regular rate and rhythm Abdomen:  Soft, non tender, no hepatosplenomegaly Pelvic:  External genitalia is normal in appearance, no lesions.  The vagina is normal in appearance. Urethra has no lesions or masses. The cervix is smooth, pap with HPV performed.  Uterus is felt to be normal size, shape, and contour.  No adnexal masses or tenderness noted.Bladder is non tender, no masses felt. Extremities/musculoskeletal:  No swelling or varicosities noted, no clubbing or cyanosis Psych:  No mood changes, alert and cooperative,seems happy PHQ 9 score 19, is on Wellbutrin, denies any suicidal ideations, just family stressors, mother in law has  breast cancer and Dad has eye issues. And her mom died 10 years ago. She still wants a baby, get husband to get SA, and will get US to assess ovaries and uterus.   Impression: 1. Encounter for gynecological examination with Papanicolaou smear of cervix   2. Menorrhagia with regular cycle   3. Excess body and facial hair   4. Menstrual cramps   5. Patient desires pregnancy   6. Vaginal itching        Plan: Meds ordered this encounter  Medications  . fluconazole (DIFLUCAN) 150 MG tablet    Sig: Take 1 monthly before period    Dispense:  1 tablet    Refill:  6    Order Specific Question:   Supervising Provider    Answer:   Duane LopeEURE, LUTHER H [[id number]]  Return in 1 week for GYN US Physical in 1 year, pap in 3 if normal Labs with PCP Get husband to see urologist for semen analysis

## 2016-11-18 LAB — CYTOLOGY - PAP
Diagnosis: NEGATIVE
HPV (WINDOPATH): NOT DETECTED

## 2016-11-25 ENCOUNTER — Ambulatory Visit (INDEPENDENT_AMBULATORY_CARE_PROVIDER_SITE_OTHER): Payer: 59

## 2016-11-25 DIAGNOSIS — N946 Dysmenorrhea, unspecified: Secondary | ICD-10-CM

## 2016-11-25 DIAGNOSIS — N920 Excessive and frequent menstruation with regular cycle: Secondary | ICD-10-CM | POA: Diagnosis not present

## 2016-11-25 NOTE — Progress Notes (Signed)
PELVIC US TA/TV: homogeneous anteverted uterus with a 5 x 3 x 5 mm simple myometrial cyst mid/lus,normal left ovary,right ovarian cyst 2.4 x 1.9 x 2.5 cm,EEC 8.3 mm,no free fluid,limited view of ovaries w/TV,no free fluid,no pain during ultrasound

## 2016-11-26 ENCOUNTER — Telehealth: Payer: Self-pay | Admitting: Adult Health

## 2016-11-26 NOTE — Telephone Encounter (Signed)
Left message that US showed normal uterus and simple cyst right ovary

## 2017-03-19 ENCOUNTER — Emergency Department (HOSPITAL_COMMUNITY)
Admission: EM | Admit: 2017-03-19 | Discharge: 2017-03-19 | Disposition: A | Discharge status: Home / Self Care | Payer: 59 | Attending: Emergency Medicine | Admitting: Emergency Medicine

## 2017-03-19 ENCOUNTER — Emergency Department (HOSPITAL_COMMUNITY): Payer: 59

## 2017-03-19 ENCOUNTER — Other Ambulatory Visit: Payer: Self-pay

## 2017-03-19 ENCOUNTER — Encounter (HOSPITAL_COMMUNITY): Payer: Self-pay

## 2017-03-19 DIAGNOSIS — Z79899 Other long term (current) drug therapy: Secondary | ICD-10-CM | POA: Diagnosis not present

## 2017-03-19 DIAGNOSIS — R42 Dizziness and giddiness: Secondary | ICD-10-CM | POA: Diagnosis not present

## 2017-03-19 DIAGNOSIS — R51 Headache: Secondary | ICD-10-CM | POA: Insufficient documentation

## 2017-03-19 DIAGNOSIS — M79602 Pain in left arm: Secondary | ICD-10-CM | POA: Insufficient documentation

## 2017-03-19 DIAGNOSIS — R519 Headache, unspecified: Secondary | ICD-10-CM

## 2017-03-19 LAB — CBC
HCT: 41.9 % (ref 36.0–46.0)
Hemoglobin: 13.9 g/dL (ref 12.0–15.0)
MCH: 30.3 pg (ref 26.0–34.0)
MCHC: 33.2 g/dL (ref 30.0–36.0)
MCV: 91.5 fL (ref 78.0–100.0)
PLATELETS: 327 10*3/uL (ref 150–400)
RBC: 4.58 MIL/uL (ref 3.87–5.11)
RDW: 13.2 % (ref 11.5–15.5)
WBC: 8.7 10*3/uL (ref 4.0–10.5)

## 2017-03-19 LAB — BASIC METABOLIC PANEL
Anion gap: 8 (ref 5–15)
BUN: 14 mg/dL (ref 6–20)
CALCIUM: 9.2 mg/dL (ref 8.9–10.3)
CO2: 22 mmol/L (ref 22–32)
CREATININE: 0.84 mg/dL (ref 0.44–1.00)
Chloride: 105 mmol/L (ref 101–111)
GFR calc non Af Amer: >60 mL/min (ref >60)
Glucose, Bld: 94 mg/dL (ref 65–99)
Potassium: 4.7 mmol/L (ref 3.5–5.1)
SODIUM: 135 mmol/L (ref 135–145)

## 2017-03-19 LAB — I-STAT TROPONIN, ED: TROPONIN I, POC: 0 ng/mL (ref 0.00–0.08)

## 2017-03-19 MED ORDER — SODIUM CHLORIDE 0.9 % IV BOLUS (SEPSIS)
1000.0000 mL | Freq: Once | INTRAVENOUS | Status: AC
  Administered 2017-03-19: 1000 mL via INTRAVENOUS

## 2017-03-19 MED ORDER — KETOROLAC TROMETHAMINE 30 MG/ML IJ SOLN
30.0000 mg | Freq: Once | INTRAMUSCULAR | Status: AC
  Administered 2017-03-19: 30 mg via INTRAVENOUS
  Filled 2017-03-19: qty 1

## 2017-03-19 MED ORDER — PROMETHAZINE HCL 25 MG/ML IJ SOLN
25.0000 mg | Freq: Once | INTRAMUSCULAR | Status: AC
  Administered 2017-03-19: 25 mg via INTRAVENOUS
  Filled 2017-03-19: qty 1

## 2017-03-19 MED ORDER — DIPHENHYDRAMINE HCL 25 MG PO CAPS
25.0000 mg | ORAL_CAPSULE | Freq: Once | ORAL | Status: AC
  Administered 2017-03-19: 25 mg via ORAL
  Filled 2017-03-19: qty 1

## 2017-03-19 NOTE — ED Notes (Addendum)
Pt ambulates to room 41 with steady gait accompanied by husband. Pt changes clothes to gown. Denies pain but c/o tingling at left arm. Pt resp even and non labored . Skin warm and dry.

## 2017-03-19 NOTE — ED Triage Notes (Signed)
Patient complains of left sided neck, arm pain and dizziness several episodes while at work today. No dizziness on arrival and pain resolved,NAD

## 2017-03-19 NOTE — Discharge Instructions (Signed)
If you headaches and dizziness persist, please follow-up with your primary care provider.  This could be related to your recent increase in Wellbutrin.   You can call and schedule a follow up appointment with cardiology today regarding your arm and neck pain and symptoms that felt like you were going to pass out.   If you have continued episodes of neck and arm pain with feeling like you are going to pass out, fever, chills, or new or worsening symptoms including slurred speech, facial drooping, or other concerning symptoms, please return to the emergency department for re-evaluation.

## 2017-03-19 NOTE — ED Notes (Signed)
Pt and husband state they understands instructions and home stable with steady gait.

## 2017-03-19 NOTE — ED Provider Notes (Signed)
[facility] EMERGENCY DEPARTMENT Provider Note   CSN: [id number] Arrival date & time: 03/19/17  1050     History   Chief Complaint Chief Complaint  Patient presents with  . left arm pain/dizziness    HPI [name] is a 36 y.o. female sent to the emergency department with a chief complaint of left left arm tingling.  She reports that she was at work sitting at her desk when she suddenly began having left-sided neck pain that radiated as tingling down her left arm to her fingertips. She reports that she also felt flushed, felt as if she might pass out, and her heart was racing.  She reports the episode lasted about 10-20 seconds before resolving.  She reports 3 additional similar episodes throughout the morning.  No history of similar.  No recent trauma or injury to her neck. She denies chest pain, dyspnea, or right arm tingling.   She states that she noted her heart was racing last night when she tried to go to sleep, but stopped after about 20 minutes.   She reports intermittent, worsening headaches over the last month.  She has a history of migraines that typically present with visual aura and pain over her right forehead.  She reports that she was on abortive medication several years ago, but have returned over the last month.  She also complains of intermittent dizziness, described as the room spinning around, that is worsened over the last month.  No aggravating or alleviating factors.  She denies disequilibrium, tinnitus, otalgia, nausea, or vomiting. LMP 02/26/17.   She reports that her Wellbutrin was increased from 150 mg daily to 300 mg daily.  She also takes Spironolactone daily, but no recent medication changes.  She was also started on Pazo drops 1 week ago after having her vision checked by her ophthalmologist.  She denies energy drinks, increased caffeine, nutritional supplements, or dietary changes.  No family history of sudden cardiac death or MI at an  early age.   The history is provided by the patient. No language interpreter was used.    Past Medical History:  Diagnosis Date  . Anxiety 01/31/2014  . Bipolar 1 disorder (HCC)   . Bipolar 1 disorder (HCC) 09/06/2012  . Breast lump on left side at 7 o'clock position 09/07/2012  . Cystic acne   . Excess body and facial hair 02/21/2015  . Heart murmur    functional  . Heavy periods 02/21/2015  . Irregular periods 01/31/2014  . Migraines    with aura  . Overweight(278.02)   . Patient desires pregnancy 01/31/2014  . PCO (polycystic ovaries) 12/21/2012  . Vaginal discharge 05/17/2013  . Yeast vaginitis 05/17/2013    Patient Active Problem List   Diagnosis Date Noted  . Menstrual cramps 11/17/2016  . Vaginal itching 11/17/2016  . Excess body and facial hair 02/21/2015  . Heavy periods 02/21/2015  . Irregular periods 01/31/2014  . Patient desires pregnancy 01/31/2014  . Anxiety 01/31/2014  . Vaginal discharge 05/17/2013  . Yeast vaginitis 05/17/2013  . PCO (polycystic ovaries) 12/21/2012  . Breast lump on left side at 7 o'clock position 09/07/2012  . Overweight(278.02) 09/06/2012  . Cystic acne 09/06/2012  . Bipolar 1 disorder (HCC) 09/06/2012  . MIGRAINE HEADACHE 03/23/2008  . GASTROESOPHAGEAL REFLUX DISEASE, CHRONIC 03/23/2008  . CONSTIPATION 03/23/2008  . CARDIAC MURMUR 03/23/2008  . ABDOMINAL PAIN 03/23/2008  . IRRITABLE BOWEL SYNDROME, HX OF 03/23/2008    Past Surgical History:  Procedure Laterality Date  .  BUNIONECTOMY Left 8 2007  . BUNIONECTOMY Right 05/26/13  . NO PAST SURGERIES      OB History    Gravida Para Term Preterm AB Living   0 0           SAB TAB Ectopic Multiple Live Births                   Home Medications    Prior to Admission medications   Medication Sig Start Date End Date Taking? Authorizing Provider  buPROPion (WELLBUTRIN XL) 300 MG 24 hr tablet Take 300 mg daily by mouth.   Yes [provider]  cetirizine (ZYRTEC) 10 MG tablet  Take 10 mg daily as needed by mouth for allergies.    Yes [provider]  ibuprofen (ADVIL,MOTRIN) 200 MG tablet Take 200 mg every 6 (six) hours as needed by mouth for moderate pain.   Yes [provider]  Olopatadine HCl (PAZEO) 0.7 % SOLN Place 1 drop daily into both eyes.   Yes [provider]  spironolactone (ALDACTONE) 25 MG tablet Take 1 tablet (25 mg total) by mouth daily. Patient taking differently: Take 50 mg by mouth daily.  12/10/15  Yes Adline PotterGriffin, Jennifer A, NP    Family History Family History  Problem Relation Age of Onset  . Cancer Maternal Grandmother        breast  . Cancer Paternal Grandmother        breast  . Other Paternal Grandmother        open heart surgery  . Hypertension Father   . Depression Mother   . Pulmonary embolism Mother   . Hypertension Paternal Grandfather   . Congestive Heart Failure Paternal Grandfather   . Other Paternal Grandfather        open heart surgery    Social History Social History   Tobacco Use  . Smoking status: Never Smoker  . Smokeless tobacco: Never Used  Substance Use Topics  . Alcohol use: No    Comment: occ.  . Drug use: No     Allergies   Patient has no known allergies.   Review of Systems Review of Systems  Constitutional: Negative for activity change.  Respiratory: Negative for cough, choking, chest tightness, shortness of breath and wheezing.   Cardiovascular: Positive for palpitations. Negative for chest pain and leg swelling.  Gastrointestinal: Negative for abdominal pain, diarrhea, nausea and vomiting.  Musculoskeletal: Negative for back pain.  Skin: Negative for rash.  Neurological: Positive for dizziness and headaches. Negative for tremors, seizures, syncope, facial asymmetry, speech difficulty, weakness, light-headedness and numbness.     Physical Exam Updated Vital Signs BP (!) 104/52 (BP Location: Right Arm)   Pulse 88   Temp 98.4 F (36.9 C) (Oral)   Resp (!) 22    LMP 02/26/2017   SpO2 100%   Physical Exam  Constitutional: She is oriented to person, place, and time. No distress.  HENT:  Head: Normocephalic.  Bilateral frontal and maxillary sinuses are nontender to palpation.  Eyes: Conjunctivae are normal.  Neck: Normal range of motion. Neck supple.  No meningeal signs.  Cardiovascular: Normal rate, regular rhythm and normal heart sounds. Exam reveals no gallop and no friction rub.  No murmur heard. Pulmonary/Chest: Effort normal and breath sounds normal. No stridor. No respiratory distress. She has no wheezes. She has no rales. She exhibits no tenderness.  Abdominal: Soft. Bowel sounds are normal. She exhibits no distension and no mass. There is no tenderness. There  is no rebound and no guarding.  Musculoskeletal: Normal range of motion.  No tenderness to palpation over the spinous process lumbar spine or surrounding paraspinal muscles.  No reproducible pain to the neck or left arm.  Radial pulses are 2+ and symmetric.  5 out of 5 symmetric strength of bilateral upper extremities against resistance.  Sensation is intact throughout.  Lymphadenopathy:    She has no cervical adenopathy.  Neurological: She is alert and oriented to person, place, and time.  Cranial nerves 2-12 intact. Finger-to-nose is normal. 5/5 motor strength of the bilateral upper and lower extremities. Moves all four extremities. Negative Romberg.  Symmetric tandem gait. NVI.    Skin: Skin is warm. Capillary refill takes less than 2 seconds. No rash noted.  Psychiatric: Her behavior is normal.  Nursing note and vitals reviewed.    ED Treatments / Results  Labs (all labs ordered are listed, but only abnormal results are displayed) Labs Reviewed  BASIC METABOLIC PANEL  CBC  I-STAT TROPONIN, ED    EKG  EKG Interpretation  Date/Time:  Thursday March 19 2017 11:24:52 EST Ventricular Rate:  89 PR Interval:  138 QRS Duration: 78 QT Interval:  362 QTC  Calculation: 440 R Axis:   39 Text Interpretation:  Normal sinus rhythm no acute ST/T changes No old tracing to compare Confirmed by Pricilla Loveless [id number]) on 03/19/2017 1:33:47 PM       Radiology Dg Chest 2 View  Result Date: 03/19/2017 CLINICAL DATA:  Chest pain EXAM: CHEST  2 VIEW COMPARISON:  None. FINDINGS: Cardiomediastinal silhouette is normal in size and configuration. Lungs are clear. Lung volumes are normal. No evidence of pneumonia. No pleural effusion. No pneumothorax seen. Osseous and soft tissue structures about the chest are unremarkable. IMPRESSION: No active cardiopulmonary disease. Electronically Signed   By: Bary Richard M.D.   On: 03/19/2017 12:42    Procedures Procedures (including critical care time)  Medications Ordered in ED Medications  sodium chloride 0.9 % bolus 1,000 mL (0 mLs Intravenous Stopped 03/19/17 1701)  ketorolac (TORADOL) 30 MG/ML injection 30 mg (30 mg Intravenous Given 03/19/17 1612)  promethazine (PHENERGAN) injection 25 mg (25 mg Intravenous Given 03/19/17 1617)  diphenhydrAMINE (BENADRYL) capsule 25 mg (25 mg Oral Given 03/19/17 1612)     Initial Impression / Assessment and Plan / ED Course  I have reviewed the triage vital signs and the nursing notes.  Pertinent labs & imaging results that were available during my care of the patient were reviewed by me and considered in my medical decision making (see chart for details).     36 y.o. female presenting with four episodes of left arm tingling that radiates down from her neck to her fingertips, near-syncope, and palpitations. She also reports worsening headaches over the last month. She has a remote h/o migraines. No trauma or injury. Normal neuro exam. Labs are unremarkable. EKG with NSR; no signs of Brugada or WPW. Vital signs are reassuring. Headache significantly improved after migraine cocktail. The patient also reports her Wellbutrin dose was increased from 150 to 300 about one month ago.  Given the onset of action, which is approximately  1-2 weeks, I question if the patient's near-syncope and worsening headaches are related to her increased Wellbutrin dose vs vasovagal etiology. Doubt MI, ingestion, vertebral dissection, or spinal cord compression.  Will d/c the patient to home with cardiology f/u. Strict return precautions given. NAD. The patient is safe for d/c at this time.   Final Clinical Impressions(s) /  ED Diagnoses   Final diagnoses:  Left arm pain  Dizziness  Bad headache    ED Discharge Orders    None       Barkley BoardsMcDonald, Alayiah Fontes A, PA-C 03/20/17 2350    Pricilla LovelessGoldston, Scott, MD 03/21/17 2002

## 2018-01-08 ENCOUNTER — Other Ambulatory Visit: Payer: Self-pay | Admitting: Adult Health

## 2018-05-13 DIAGNOSIS — R790 Abnormal level of blood mineral: Secondary | ICD-10-CM | POA: Diagnosis not present

## 2018-05-13 DIAGNOSIS — E8881 Metabolic syndrome: Secondary | ICD-10-CM | POA: Diagnosis not present

## 2018-05-13 DIAGNOSIS — E786 Lipoprotein deficiency: Secondary | ICD-10-CM | POA: Diagnosis not present

## 2018-05-13 DIAGNOSIS — F329 Major depressive disorder, single episode, unspecified: Secondary | ICD-10-CM | POA: Diagnosis not present

## 2018-05-13 DIAGNOSIS — Z6837 Body mass index (BMI) 37.0-37.9, adult: Secondary | ICD-10-CM | POA: Diagnosis not present

## 2018-05-20 DIAGNOSIS — K649 Unspecified hemorrhoids: Secondary | ICD-10-CM | POA: Diagnosis not present

## 2018-05-20 DIAGNOSIS — R634 Abnormal weight loss: Secondary | ICD-10-CM | POA: Diagnosis not present

## 2018-05-20 DIAGNOSIS — F411 Generalized anxiety disorder: Secondary | ICD-10-CM | POA: Diagnosis not present

## 2018-05-20 DIAGNOSIS — F39 Unspecified mood [affective] disorder: Secondary | ICD-10-CM | POA: Diagnosis not present

## 2018-05-27 DIAGNOSIS — F329 Major depressive disorder, single episode, unspecified: Secondary | ICD-10-CM | POA: Diagnosis not present

## 2018-05-28 ENCOUNTER — Encounter: Payer: Self-pay | Admitting: Gastroenterology

## 2018-05-28 ENCOUNTER — Ambulatory Visit: Payer: BLUE CROSS/BLUE SHIELD | Admitting: Gastroenterology

## 2018-05-28 DIAGNOSIS — K6289 Other specified diseases of anus and rectum: Secondary | ICD-10-CM | POA: Insufficient documentation

## 2018-05-28 NOTE — Patient Instructions (Addendum)
I have called in the cream to [place] with nitroglycerin compounded in it. Use this four times a day for the next 4 weeks. (They open at 9, so that is when it will be officially called in. Wait for them to call you that it is ready).   I will see you back in 4-6 weeks to see how you are doing.  It was a pleasure to see you today. I strive to create trusting relationships with patients to provide genuine, compassionate, and quality care. I value your feedback. If you receive a survey regarding your visit,  I greatly appreciate you taking time to fill this out.   Gelene Mink, PhD, ANP-BC [facility]     Anal Fissure, Adult  An anal fissure is a small tear or crack in the tissue of the anus. Bleeding from a fissure usually stops on its own within a few minutes. However, bleeding will often occur again with each bowel movement until the fissure heals. What are the causes? This condition is usually caused by passing a large or hard stool (feces). Other causes include:  Constipation.  Frequent diarrhea.  Inflammatory bowel disease (Crohn's disease or ulcerative colitis).  Childbirth.  Infections.  Anal sex. What are the signs or symptoms? Symptoms of this condition include:  Bleeding from the rectum.  Small amounts of blood seen on your stool, on the toilet paper, or in the toilet after a bowel movement. The blood coats the outside of the stool and is not mixed with the stool.  Painful bowel movements.  Itching or irritation around the anus. How is this diagnosed? A health care provider may diagnose this condition by closely examining the anal area. An anal fissure can usually be seen with careful inspection. In some cases, a rectal exam may be performed, or a short tube (anoscope) may be used to examine the anal canal. How is this treated? Initial treatment for this condition may include:  Taking steps to avoid constipation. This may include making  changes to your diet, such as increasing your intake of fiber or fluid.  Taking fiber supplements. These supplements can soften your stool to help make bowel movements easier. Your health care provider may also prescribe a stool softener if your stool is hard.  Taking sitz baths. This may help to heal the tear.  Using medicated creams or ointments. These may be prescribed to lessen discomfort. Treatments that are sometimes used if initial treatments do not work well or if the condition is more severe may include:  Botulinum injection.  Surgery to repair the fissure. Follow these instructions at home: Eating and drinking   Avoid foods that may cause constipation, such as bananas, milk, and other dairy products.  Eat all fruits, except bananas.  Drink enough fluid to keep your urine pale yellow.  Eat foods that are high in fiber, such as beans, whole grains, and fresh fruits and vegetables. General instructions   Take over-the-counter and prescription medicines only as told by your health care provider.  Use creams or ointments only as told by your health care provider.  Keep the anal area clean and dry.  Take sitz baths as told by your health care provider. Do not use soap in the sitz baths.  Keep all follow-up visits as told by your health care provider. This is important. Contact a health care provider if you have:  More bleeding.  A fever.  Diarrhea that is mixed with blood.  Pain that continues.  Ongoing problems that are getting worse rather than better. Summary  An anal fissure is a small tear or crack in the tissue of the anus. This condition is usually caused by passing a large or hard stool (feces). Other causes include constipation and frequent diarrhea.  Initial treatment for this condition may include taking steps to avoid constipation, such as increasing your intake of fiber or fluid.  Follow instructions for care as told by your health care  provider.  Contact your health care provider if you have more bleeding or your problem is getting worse rather than better.  Keep all follow-up visits as told by your health care provider. This is important. This information is not intended to replace advice given to you by your health care provider. Make sure you discuss any questions you have with your health care provider. Document Released: 04/28/2005 Document Revised: 10/08/2017 Document Reviewed: 10/08/2017 Elsevier Interactive Patient Education  2019 [employer].

## 2018-05-28 NOTE — Progress Notes (Addendum)
Primary Care Physician:  Benita Stabile, MD Primary Gastroenterologist:  Dr. Jena Gauss  Chief Complaint  Patient presents with  . Hemorrhoids  . Rectal Bleeding    HPI:   [name] is a 38 y.o. female presenting today at the request of Dr. Margo Aye due to rectal bleeding, hemorrhoids, concern for anal fissure.   2-year-history of rectal itching, pain, paper hematochezia. Discomfort with BM. Feels like cutting. Denies straining. Doesn't feel constipated. Worsened the past 3 weeks where constantly uncomfortable. When standing, feels like something is coming out. Denies any need for manual reduction.   [facility] cream for about a week with lidocaine. No nitro compounded yet.   Colonoscopy in 2009 by Dr. Jena Gauss: normal.   Past Medical History:  Diagnosis Date  . Anxiety 01/31/2014  . Bipolar 1 disorder (HCC)   . Bipolar 1 disorder (HCC) 09/06/2012  . Breast lump on left side at 7 o'clock position 09/07/2012  . Cystic acne   . Excess body and facial hair 02/21/2015  . Heart murmur    functional  . Heavy periods 02/21/2015  . Irregular periods 01/31/2014  . Migraines    with aura  . Overweight(278.02)   . Patient desires pregnancy 01/31/2014  . PCO (polycystic ovaries) 12/21/2012  . Vaginal discharge 05/17/2013  . Yeast vaginitis 05/17/2013    Past Surgical History:  Procedure Laterality Date  . BUNIONECTOMY Left 8 2007  . BUNIONECTOMY Right 05/26/13  . COLONOSCOPY  2009   Dr. Jena Gauss: normal     Current Outpatient Medications  Medication Sig Dispense Refill  . buPROPion (WELLBUTRIN XL) 150 MG 24 hr tablet Take 1 tablet by mouth daily.    . cetirizine (ZYRTEC) 10 MG tablet Take 10 mg daily as needed by mouth for allergies.     . Cholecalciferol (VITAMIN D3) 125 MCG (5000 UT) TABS Take by mouth daily.    Marland Kitchen ibuprofen (ADVIL,MOTRIN) 200 MG tablet Take 200 mg every 6 (six) hours as needed by mouth for moderate pain.    . NONFORMULARY OR COMPOUNDED ITEM Hydrocortisone  2.5%, Lidocaine 5%, Phenylephrine 0.25%, Pramoxine 1% hemorrhoid cream TID PRN    . phentermine 37.5 MG capsule Take by mouth. Takes 1/2 tablet twice weekly    . spironolactone (ALDACTONE) 100 MG tablet Take 100 mg by mouth daily.     No current facility-administered medications for this visit.     Allergies as of 05/28/2018  . (No Known Allergies)    Family History  Problem Relation Age of Onset  . Cancer Maternal Grandmother        breast  . Cancer Paternal Grandmother        breast  . Other Paternal Grandmother        open heart surgery  . Hypertension Father   . Depression Mother   . Pulmonary embolism Mother   . Hypertension Paternal Grandfather   . Congestive Heart Failure Paternal Grandfather   . Other Paternal Grandfather        open heart surgery  . Colon cancer Neg Hx   . Colon polyps Neg Hx     Social History   Socioeconomic History  . Marital status: Married    Spouse name: Not on file  . Number of children: Not on file  . Years of education: Not on file  . Highest education level: Not on file  Occupational History  . Not on file  Social Needs  . Financial resource strain: Not on file  .  Food insecurity:    Worry: Not on file    Inability: Not on file  . Transportation needs:    Medical: Not on file    Non-medical: Not on file  Tobacco Use  . Smoking status: Never Smoker  . Smokeless tobacco: Never Used  Substance and Sexual Activity  . Alcohol use: Not Currently    Comment: occ.  . Drug use: No  . Sexual activity: Yes    Birth control/protection: None  Lifestyle  . Physical activity:    Days per week: Not on file    Minutes per session: Not on file  . Stress: Not on file  Relationships  . Social connections:    Talks on phone: Not on file    Gets together: Not on file    Attends religious service: Not on file    Active member of club or organization: Not on file    Attends meetings of clubs or organizations: Not on file    Relationship  status: Not on file  . Intimate partner violence:    Fear of current or ex partner: Not on file    Emotionally abused: Not on file    Physically abused: Not on file    Forced sexual activity: Not on file  Other Topics Concern  . Not on file  Social History Narrative  . Not on file    Review of Systems: Gen: Denies any fever, chills, fatigue, weight loss, lack of appetite.  CV: Denies chest pain, heart palpitations, peripheral edema, syncope.  Resp: Denies shortness of breath at rest or with exertion. Denies wheezing or cough.  GI: see HPI GU : Denies urinary burning, urinary frequency, urinary hesitancy MS: Denies joint pain, muscle weakness, cramps, or limitation of movement.  Derm: Denies rash, itching, dry skin Psych: Denies depression, anxiety, memory loss, and confusion Heme: see HPI  Physical Exam: BP 123/77   Pulse 98   Temp 98.3 F (36.8 C) (Oral)   Ht 5\' 1"  (1.549 m)   Wt 202 lb 3.2 oz (91.7 kg)   LMP 05/26/2018 (Exact Date)   BMI 38.21 kg/m  General:   Alert and oriented. Pleasant and cooperative. Well-nourished and well-developed.  Head:  Normocephalic and atraumatic. Eyes:  Without icterus, sclera clear and conjunctiva pink.  Ears:  Normal auditory acuity. Nose:  No deformity, discharge,  or lesions. Mouth:  No deformity or lesions, oral mucosa pink.  Lungs:  Clear to auscultation bilaterally. No wheezes, rales, or rhonchi. No distress.  Heart:  S1, S2 present without murmurs appreciated.  Abdomen:  +BS, soft, non-tender and non-distended. No HSM noted. No guarding or rebound. No masses appreciated.  Rectal:  No obvious fissure. No external hemorrhoids. Difficult to complete internal exam due to discomfort.  Msk:  Symmetrical without gross deformities. Normal posture. Extremities:  Without edema. Neurologic:  Alert and  oriented x4 Psych:  Alert and cooperative. Normal mood and affect.

## 2018-06-06 NOTE — Assessment & Plan Note (Signed)
38 year old female with chronic intermittent low-volume rectal bleeding and now with symptoms consistent with likely anal fissure. Rectal exam unrevealing but DRE uncomfortable and incomplete. Will treat empirically with [facility] hemorrhoid cream, compounded with 0.125% nitroglycerin QID for the next 2-4 weeks. She will return in 4-6 weeks for evaluation. Will arrange colonoscopy at that time to exclude any other occult source. Likely does have internal hemorrhoids as well, and this can be assessed at time of colonoscopy.

## 2018-06-08 NOTE — Progress Notes (Signed)
CC'D TO PCP °

## 2018-06-10 DIAGNOSIS — E8881 Metabolic syndrome: Secondary | ICD-10-CM | POA: Diagnosis not present

## 2018-06-10 DIAGNOSIS — E786 Lipoprotein deficiency: Secondary | ICD-10-CM | POA: Diagnosis not present

## 2018-06-10 DIAGNOSIS — E559 Vitamin D deficiency, unspecified: Secondary | ICD-10-CM | POA: Diagnosis not present

## 2018-06-10 DIAGNOSIS — R790 Abnormal level of blood mineral: Secondary | ICD-10-CM | POA: Diagnosis not present

## 2018-06-10 DIAGNOSIS — F329 Major depressive disorder, single episode, unspecified: Secondary | ICD-10-CM | POA: Diagnosis not present

## 2018-06-21 DIAGNOSIS — F329 Major depressive disorder, single episode, unspecified: Secondary | ICD-10-CM | POA: Diagnosis not present

## 2018-07-01 DIAGNOSIS — E8881 Metabolic syndrome: Secondary | ICD-10-CM | POA: Diagnosis not present

## 2018-07-01 DIAGNOSIS — Z6836 Body mass index (BMI) 36.0-36.9, adult: Secondary | ICD-10-CM | POA: Diagnosis not present

## 2018-07-05 ENCOUNTER — Ambulatory Visit: Payer: BLUE CROSS/BLUE SHIELD | Admitting: Gastroenterology

## 2018-07-05 ENCOUNTER — Encounter: Payer: Self-pay | Admitting: *Deleted

## 2018-07-05 ENCOUNTER — Encounter: Payer: Self-pay | Admitting: Gastroenterology

## 2018-07-05 VITALS — BP 135/78 | HR 84 | Temp 97.7°F | Ht 61.0 in | Wt 194.2 lb

## 2018-07-05 DIAGNOSIS — K625 Hemorrhage of anus and rectum: Secondary | ICD-10-CM

## 2018-07-05 DIAGNOSIS — K6289 Other specified diseases of anus and rectum: Secondary | ICD-10-CM

## 2018-07-05 DIAGNOSIS — I10 Essential (primary) hypertension: Secondary | ICD-10-CM | POA: Diagnosis not present

## 2018-07-05 DIAGNOSIS — R7301 Impaired fasting glucose: Secondary | ICD-10-CM | POA: Diagnosis not present

## 2018-07-05 MED ORDER — CLENPIQ 10-3.5-12 MG-GM -GM/160ML PO SOLN: 1.0000 | Freq: Once | ORAL | 0 refills | Status: AC

## 2018-07-05 NOTE — Assessment & Plan Note (Addendum)
38 year old very pleasant female with chronic history of intermittent low-volume hematochezia and recently empirically treated for likely anal fissure. Symptomatically improved and now without any further rectal bleeding since Jan 2020. Still with slight rectal discomfort at times. Discussed colonoscopy to rule out any occult etiology; however, I suspect dealing with anal fissure,+/- internal hemorrhoids. Last colonoscopy normal in 2009.  Proceed with TCS with Dr. Jena Gauss in near future: the risks, benefits, and alternatives have been discussed with the patient in detail. The patient states understanding and desires to proceed. Propofol due to polypharmacy Continue the compounded cream [facility] with lidocaine and nitro) Return in 3-4  months

## 2018-07-05 NOTE — Patient Instructions (Signed)
You can continue the cream 3-4 times per day.   We have arranged a colonoscopy with Dr. Jena Gauss in the near future.  I will see you in 3-4 months thereafter!  Great job on the continued weight loss and healthy changes!  I enjoyed seeing you again today! As you know, I value our relationship and want to provide genuine, compassionate, and quality care. I welcome your feedback. If you receive a survey regarding your visit,  I greatly appreciate you taking time to fill this out. See you next time!  Gelene Mink, PhD, ANP-BC [facility]

## 2018-07-05 NOTE — Progress Notes (Signed)
Referring Provider: Benita Stabile, MD Primary Care Physician:  Benita Stabile, MD  Primary GI: Dr. Jena Gauss   Chief Complaint  Patient presents with  . Follow-up    HPI:   [name] is a 38 y.o. female presenting today in follow-up after consultation Jan 2020 due to rectal bleeding and pain. [facility] with nitro compounded was sent to pharmacy, with plans for colonoscopy once symptomatically improved. She reported a chronic history of rectal itching, discomfort, paper hematochezia. Returns now in follow-up.   Supposed to be taking iron but afraid to take due to constipation. No straining. No abdominal pain. No constipation. Intermittent reflux but only needs to take omeprazole OTC as needed. Intentionally working on losing weight. Low carb diet. No further rectal bleeding. Irritation mild now. Feels much improved with hemorrhoid cream.   Past Medical History:  Diagnosis Date  . Anxiety 01/31/2014  . Bipolar 1 disorder (HCC)   . Bipolar 1 disorder (HCC) 09/06/2012  . Breast lump on left side at 7 o'clock position 09/07/2012  . Cystic acne   . Excess body and facial hair 02/21/2015  . Heart murmur    functional  . Heavy periods 02/21/2015  . Irregular periods 01/31/2014  . Migraines    with aura  . Overweight(278.02)   . Patient desires pregnancy 01/31/2014  . PCO (polycystic ovaries) 12/21/2012  . Vaginal discharge 05/17/2013  . Yeast vaginitis 05/17/2013    Past Surgical History:  Procedure Laterality Date  . BUNIONECTOMY Left 8 2007  . BUNIONECTOMY Right 05/26/13  . COLONOSCOPY  2009   Dr. Jena Gauss: normal     Current Outpatient Medications  Medication Sig Dispense Refill  . buPROPion (WELLBUTRIN XL) 150 MG 24 hr tablet Take 1 tablet by mouth daily.    . cetirizine (ZYRTEC) 10 MG tablet Take 10 mg daily as needed by mouth for allergies.     . Cholecalciferol (VITAMIN D3) 125 MCG (5000 UT) TABS Take by mouth daily.    Marland Kitchen ibuprofen (ADVIL,MOTRIN) 200 MG tablet Take  200 mg every 6 (six) hours as needed by mouth for moderate pain.    . NONFORMULARY OR COMPOUNDED ITEM Hydrocortisone 2.5%, Lidocaine 5%, Phenylephrine 0.25%, Pramoxine 1% hemorrhoid cream TID PRN    . omeprazole (PRILOSEC) 20 MG capsule Take 20 mg by mouth as needed.    Marland Kitchen spironolactone (ALDACTONE) 100 MG tablet Take 100 mg by mouth daily.     No current facility-administered medications for this visit.     Allergies as of 07/05/2018  . (No Known Allergies)    Family History  Problem Relation Age of Onset  . Cancer Maternal Grandmother        breast  . Cancer Paternal Grandmother        breast  . Other Paternal Grandmother        open heart surgery  . Hypertension Father   . Depression Mother   . Pulmonary embolism Mother   . Hypertension Paternal Grandfather   . Congestive Heart Failure Paternal Grandfather   . Other Paternal Grandfather        open heart surgery  . Colon cancer Neg Hx   . Colon polyps Neg Hx     Social History   Socioeconomic History  . Marital status: Married    Spouse name: Not on file  . Number of children: Not on file  . Years of education: Not on file  . Highest education level: Not on file  Occupational History  .  Not on file  Social Needs  . Financial resource strain: Not on file  . Food insecurity:    Worry: Not on file    Inability: Not on file  . Transportation needs:    Medical: Not on file    Non-medical: Not on file  Tobacco Use  . Smoking status: Never Smoker  . Smokeless tobacco: Never Used  Substance and Sexual Activity  . Alcohol use: Not Currently    Comment: occ.  . Drug use: No  . Sexual activity: Yes    Birth control/protection: None  Lifestyle  . Physical activity:    Days per week: Not on file    Minutes per session: Not on file  . Stress: Not on file  Relationships  . Social connections:    Talks on phone: Not on file    Gets together: Not on file    Attends religious service: Not on file    Active member  of club or organization: Not on file    Attends meetings of clubs or organizations: Not on file    Relationship status: Not on file  Other Topics Concern  . Not on file  Social History Narrative  . Not on file    Review of Systems: Gen: Denies fever, chills, anorexia. Denies fatigue, weakness, weight loss.  CV: Denies chest pain, palpitations, syncope, peripheral edema, and claudication. Resp: Denies dyspnea at rest, cough, wheezing, coughing up blood, and pleurisy. GI: see HPI Derm: Denies rash, itching, dry skin Psych: Denies depression, anxiety, memory loss, confusion. No homicidal or suicidal ideation.  Heme: Denies bruising, bleeding, and enlarged lymph nodes.  Physical Exam: BP 135/78   Pulse 84   Temp 97.7 F (36.5 C) (Oral)   Ht 5\' 1"  (1.549 m)   Wt 194 lb 3.2 oz (88.1 kg)   LMP 06/19/2018   BMI 36.69 kg/m  General:   Alert and oriented. No distress noted. Pleasant and cooperative.  Head:  Normocephalic and atraumatic. Eyes:  Conjuctiva clear without scleral icterus. Mouth:  Oral mucosa pink and moist.  Lungs: clear to auscultation bilaterally Cardiac: S1 S2 present without murmurs  Abdomen:  +BS, soft, non-tender and non-distended. No rebound or guarding. No HSM or masses noted. Msk:  Symmetrical without gross deformities. Normal posture. Extremities:  Without edema. Neurologic:  Alert and  oriented x4 Psych:  Alert and cooperative. Normal mood and affect.

## 2018-07-05 NOTE — Progress Notes (Signed)
cc'd to pcp 

## 2018-07-06 ENCOUNTER — Telehealth: Payer: Self-pay | Admitting: *Deleted

## 2018-07-06 NOTE — Telephone Encounter (Signed)
Pre-op scheduled for 08/24/2018 at 1:45pm. Patient aware. Letter mailed

## 2018-07-07 DIAGNOSIS — F331 Major depressive disorder, recurrent, moderate: Secondary | ICD-10-CM | POA: Diagnosis not present

## 2018-07-07 DIAGNOSIS — F329 Major depressive disorder, single episode, unspecified: Secondary | ICD-10-CM | POA: Diagnosis not present

## 2018-07-07 DIAGNOSIS — Z23 Encounter for immunization: Secondary | ICD-10-CM | POA: Diagnosis not present

## 2018-07-07 DIAGNOSIS — Z0000 Encounter for general adult medical examination without abnormal findings: Secondary | ICD-10-CM | POA: Diagnosis not present

## 2018-07-07 DIAGNOSIS — R7301 Impaired fasting glucose: Secondary | ICD-10-CM | POA: Diagnosis not present

## 2018-07-16 ENCOUNTER — Ambulatory Visit (INDEPENDENT_AMBULATORY_CARE_PROVIDER_SITE_OTHER): Payer: BLUE CROSS/BLUE SHIELD | Admitting: Adult Health

## 2018-07-16 ENCOUNTER — Encounter: Payer: Self-pay | Admitting: Adult Health

## 2018-07-16 VITALS — BP 110/74 | HR 78 | Ht 62.0 in | Wt 193.4 lb

## 2018-07-16 DIAGNOSIS — N946 Dysmenorrhea, unspecified: Secondary | ICD-10-CM | POA: Insufficient documentation

## 2018-07-16 DIAGNOSIS — Z01419 Encounter for gynecological examination (general) (routine) without abnormal findings: Secondary | ICD-10-CM

## 2018-07-16 DIAGNOSIS — N920 Excessive and frequent menstruation with regular cycle: Secondary | ICD-10-CM | POA: Diagnosis not present

## 2018-07-16 NOTE — Progress Notes (Signed)
[name], female   DOB: 09/04/1980, 38 y.o.   MRN: [id number] History of Present Illness: [name] is a 38 year old white female, married, G0P0 in for well woman gyn exam, she had normal pap with negative HPV 11/17/16. She had physical with PCP and labs last week.Labs were normal, except BS 110. PCP is Dr Margo Aye.    Current Medications, Allergies, Past Medical History, Past Surgical History, Family History and Social History were reviewed in [employer] record.     Review of Systems: Patient denies any headaches, hearing loss, fatigue, blurred vision, shortness of breath, chest pain, abdominal pain, problems with bowel movements, urination, or intercourse. No joint pain or mood swings. Periods have been more regular since losing weight, has lost about 50 lbs since las May.She saw nutritionist  and is working out, husband started doing it too, in January and lost 20 lbs. Periods last about 7 days, with cramps and changes  probably about every 8 hours.    Physical Exam:BP 110/74 (BP Location: Left Arm, Patient Position: Sitting, Cuff Size: Large)   Pulse 78   Ht 5\' 2"  (1.575 m)   Wt 193 lb 6.4 oz (87.7 kg)   LMP 07/09/2018   BMI 35.37 kg/m  General:  Well developed, well nourished, no acute distress Skin:  Warm and dry Neck:  Midline trachea, normal thyroid, good ROM, no lymphadenopathy Lungs; Clear to auscultation bilaterally Breast:  No dominant palpable mass, retraction, or nipple discharge Cardiovascular: Regular rate and rhythm Abdomen:  Soft, non tender, no hepatosplenomegaly Pelvic:  External genitalia is normal in appearance, no lesions.  The vagina is normal in appearance,+dark blood. Urethra has no lesions or masses. The cervix is nulliparous.  Uterus is felt to be normal size, shape, and contour.  No adnexal masses or tenderness noted.Bladder is non tender, no masses felt. Extremities/musculoskeletal:  No swelling or varicosities noted, no  clubbing or cyanosis Psych:  No mood changes, alert and cooperative,seems happy Fall risk is low. PHQ 9 score 0. Examination chaperoned by Federico Flake CMA. Discussed that could resume OCs if wanted, for period control, just let me know.She is aware she could have ovulation and she is Ok if gets pregnant, but doubts that will happen, husband never had SA. Praised over weight loss.   Impression: 1. Encounter for well woman exam with routine gynecological exam   2. Menorrhagia with regular cycle   3. Dysmenorrhea       Plan: Pap and physical in 1 year Labs with PCP

## 2018-08-12 ENCOUNTER — Telehealth: Payer: Self-pay

## 2018-08-12 NOTE — Telephone Encounter (Signed)
Pt called office and requested to reschedule TCS w/Propofol w/RMR that was for 08/30/18 d/t COVID-19. TCS rescheduled to 10/18/18 at 9:15am. Endo scheduler informed. Pre-op appt rescheduled to 10/13/18 at 11:00am. Letter mailed with new procedure instructions.

## 2018-08-24 ENCOUNTER — Other Ambulatory Visit (HOSPITAL_COMMUNITY): Payer: BLUE CROSS/BLUE SHIELD

## 2018-09-21 DIAGNOSIS — F329 Major depressive disorder, single episode, unspecified: Secondary | ICD-10-CM | POA: Diagnosis not present

## 2018-10-05 ENCOUNTER — Encounter: Payer: Self-pay | Admitting: Gastroenterology

## 2018-10-05 ENCOUNTER — Other Ambulatory Visit: Payer: Self-pay

## 2018-10-05 ENCOUNTER — Ambulatory Visit (INDEPENDENT_AMBULATORY_CARE_PROVIDER_SITE_OTHER): Payer: BLUE CROSS/BLUE SHIELD | Admitting: Gastroenterology

## 2018-10-05 DIAGNOSIS — F329 Major depressive disorder, single episode, unspecified: Secondary | ICD-10-CM | POA: Diagnosis not present

## 2018-10-05 DIAGNOSIS — K219 Gastro-esophageal reflux disease without esophagitis: Secondary | ICD-10-CM

## 2018-10-05 DIAGNOSIS — K6289 Other specified diseases of anus and rectum: Secondary | ICD-10-CM

## 2018-10-05 DIAGNOSIS — R1310 Dysphagia, unspecified: Secondary | ICD-10-CM | POA: Diagnosis not present

## 2018-10-05 MED ORDER — HYDROCORTISONE (PERIANAL) 2.5 % EX CREA: TOPICAL_CREAM | CUTANEOUS | 2 refills | Status: DC

## 2018-10-05 MED ORDER — PANTOPRAZOLE SODIUM 40 MG PO TBEC: 40.0000 mg | DELAYED_RELEASE_TABLET | Freq: Every day | ORAL | 3 refills | Status: DC

## 2018-10-05 NOTE — Progress Notes (Signed)
Primary Care Physician:  Benita Stabile, MD  Primary GI: Dr. Jena Gauss   Patient Location: Home   Provider Location: [facility] office   Reason for Visit: Follow-up    Persons present on the virtual encounter, with roles: Patient, NP   Total time (minutes) spent on medical discussion: 10 minutes   Due to COVID-19, visit was conducted using virtual method.  Visit was requested by patient.  Virtual Visit via Telephone Note Due to COVID-19, visit is conducted virtually and was requested by patient.   I connected with [name] on 10/05/18 at  1:30 PM EDT by telephone and verified that I am speaking with the correct person using two identifiers.   I discussed the limitations, risks, security and privacy concerns of performing an evaluation and management service by telephone and the availability of in person appointments. I also discussed with the patient that there may be a patient responsible charge related to this service. The patient expressed understanding and agreed to proceed.  Chief Complaint  Patient presents with  . Rectal Pain    scheduled for TCS 10/18/18  . Gastroesophageal Reflux     History of Present Illness: 38 year old delightful female with history of rectal bleeding and pain, previously treated for likely anal fissure. She has been scheduled for a colonoscopy in June 2020. Last colonoscopy normal in 2009.   Increased ointment to QID. Feels more irritation lately. Has had the cutting feeling with BMs but not severe as it was in the past. Occasional rectal bleeding. No constipation. No abdominal pain. Having issues with reflux currently more than the past. Every other day will have reflux. Taking alka seltzer pm. Usually symptoms in the evening. Rice and bread are more difficult. Solid food dysphagia for about 2 months. Brother recently diagnosed with EOE. Needs refill on hemorrhoid cream.   Maintaining weight. Not exercising as much.   Past Medical History:   Diagnosis Date  . Anxiety 01/31/2014  . Bipolar 1 disorder (HCC)   . Bipolar 1 disorder (HCC) 09/06/2012  . Breast lump on left side at 7 o'clock position 09/07/2012  . Cystic acne   . Excess body and facial hair 02/21/2015  . Heart murmur    functional  . Heavy periods 02/21/2015  . Irregular periods 01/31/2014  . Migraines    with aura  . Overweight(278.02)   . Patient desires pregnancy 01/31/2014  . PCO (polycystic ovaries) 12/21/2012  . Vaginal discharge 05/17/2013  . Yeast vaginitis 05/17/2013     Past Surgical History:  Procedure Laterality Date  . BUNIONECTOMY Left 8 2007  . BUNIONECTOMY Right 05/26/13  . COLONOSCOPY  2009   Dr. Jena Gauss: normal      Current Meds  Medication Sig  . Aspirin-Diphenhydramine (ALKA-SELTZER PM PO) Take by mouth as needed.  Marland Kitchen buPROPion (WELLBUTRIN XL) 150 MG 24 hr tablet Take 1 tablet by mouth daily.  . cetirizine (ZYRTEC) 10 MG tablet Take 10 mg daily as needed by mouth for allergies.   . Cholecalciferol (VITAMIN D3) 125 MCG (5000 UT) TABS Take by mouth daily.  Marland Kitchen ibuprofen (ADVIL,MOTRIN) 200 MG tablet Take 200 mg every 6 (six) hours as needed by mouth for moderate pain.  . NONFORMULARY OR COMPOUNDED ITEM Hydrocortisone 2.5%, Lidocaine 5%, Phenylephrine 0.25%, Pramoxine 1% hemorrhoid cream TID PRN  . spironolactone (ALDACTONE) 100 MG tablet Take 100 mg by mouth daily.     Family History  Problem Relation Age of Onset  . Cancer Maternal Grandmother  breast  . Cancer Paternal Grandmother        breast  . Other Paternal Grandmother        open heart surgery  . Hypertension Father   . Depression Mother   . Pulmonary embolism Mother   . Hypertension Paternal Grandfather   . Congestive Heart Failure Paternal Grandfather   . Other Paternal Grandfather        open heart surgery  . Colon cancer Neg Hx   . Colon polyps Neg Hx     Social History   Socioeconomic History  . Marital status: Married    Spouse name: Not on file  . Number of  children: Not on file  . Years of education: Not on file  . Highest education level: Not on file  Occupational History  . Not on file  Social Needs  . Financial resource strain: Not on file  . Food insecurity:    Worry: Not on file    Inability: Not on file  . Transportation needs:    Medical: Not on file    Non-medical: Not on file  Tobacco Use  . Smoking status: Never Smoker  . Smokeless tobacco: Never Used  Substance and Sexual Activity  . Alcohol use: Not Currently    Comment: occ.  . Drug use: No  . Sexual activity: Yes    Birth control/protection: None  Lifestyle  . Physical activity:    Days per week: Not on file    Minutes per session: Not on file  . Stress: Not on file  Relationships  . Social connections:    Talks on phone: Not on file    Gets together: Not on file    Attends religious service: Not on file    Active member of club or organization: Not on file    Attends meetings of clubs or organizations: Not on file    Relationship status: Not on file  Other Topics Concern  . Not on file  Social History Narrative  . Not on file       Review of Systems: Gen: Denies fever, chills, anorexia. Denies fatigue, weakness, weight loss.  CV: Denies chest pain, palpitations, syncope, peripheral edema, and claudication. Resp: Denies dyspnea at rest, cough, wheezing, coughing up blood, and pleurisy. GI: see HPI Derm: Denies rash, itching, dry skin Psych: Denies depression, anxiety, memory loss, confusion. No homicidal or suicidal ideation.  Heme: see HPI  Observations/Objective: No distress. Video encounter with patient alert and oriented, well-dressed, maintains eye contact. Pleasant and cooperative.   Assessment and Plan: 38 year old female with intermittent low-volume hematochezia and rectal pain likely due to anal fissure, noting improvement with compounded [facility] cream with nitro. As last colonoscopy was in 2009, she is scheduled for diagnostic  colonoscopy in the near future with Dr. Jena Gaussourk. GERD symptoms have flared recently, and she notes vague dysphagia with rice/bread but not severe. She would like to pursue colonoscopy now as already planned and anticipate EGD/dilatation electively in the future if no improvement with PPI.   Proceed with TCS with Dr. Jena Gaussourk in near future: the risks, benefits, and alternatives have been discussed with the patient in detail. The patient states understanding and desires to proceed. Propofol due to polypharmacy Start Protonix once daily Follow-up in 4 months GERD diet provided.   Follow Up Instructions:    I discussed the assessment and treatment plan with the patient. The patient was provided an opportunity to ask questions and all were answered. The patient agreed  with the plan and demonstrated an understanding of the instructions.   The patient was advised to call back or seek an in-person evaluation if the symptoms worsen or if the condition fails to improve as anticipated.  I provided 10 minutes of  face-to-face time during this encounter.  Gelene Mink, PhD, ANP-BC [facility]

## 2018-10-05 NOTE — H&P (View-Only) (Signed)
  Primary Care Physician:  Hall, John Z, MD  Primary GI: Dr. Rourk   Patient Location: Home   Provider Location: [facility] office   Reason for Visit: Follow-up    Persons present on the virtual encounter, with roles: Patient, NP   Total time (minutes) spent on medical discussion: 10 minutes   Due to COVID-19, visit was conducted using virtual method.  Visit was requested by patient.  Virtual Visit via Telephone Note Due to COVID-19, visit is conducted virtually and was requested by patient.   I connected with [name] on 10/05/18 at  1:30 PM EDT by telephone and verified that I am speaking with the correct person using two identifiers.   I discussed the limitations, risks, security and privacy concerns of performing an evaluation and management service by telephone and the availability of in person appointments. I also discussed with the patient that there may be a patient responsible charge related to this service. The patient expressed understanding and agreed to proceed.  Chief Complaint  Patient presents with  . Rectal Pain    scheduled for TCS 10/18/18  . Gastroesophageal Reflux     History of Present Illness: 38-year-old delightful female with history of rectal bleeding and pain, previously treated for likely anal fissure. She has been scheduled for a colonoscopy in June 2020. Last colonoscopy normal in 2009.   Increased ointment to QID. Feels more irritation lately. Has had the cutting feeling with BMs but not severe as it was in the past. Occasional rectal bleeding. No constipation. No abdominal pain. Having issues with reflux currently more than the past. Every other day will have reflux. Taking alka seltzer pm. Usually symptoms in the evening. Rice and bread are more difficult. Solid food dysphagia for about 2 months. Brother recently diagnosed with EOE. Needs refill on hemorrhoid cream.   Maintaining weight. Not exercising as much.   Past Medical History:   Diagnosis Date  . Anxiety 01/31/2014  . Bipolar 1 disorder (HCC)   . Bipolar 1 disorder (HCC) 09/06/2012  . Breast lump on left side at 7 o'clock position 09/07/2012  . Cystic acne   . Excess body and facial hair 02/21/2015  . Heart murmur    functional  . Heavy periods 02/21/2015  . Irregular periods 01/31/2014  . Migraines    with aura  . Overweight(278.02)   . Patient desires pregnancy 01/31/2014  . PCO (polycystic ovaries) 12/21/2012  . Vaginal discharge 05/17/2013  . Yeast vaginitis 05/17/2013     Past Surgical History:  Procedure Laterality Date  . BUNIONECTOMY Left 8 2007  . BUNIONECTOMY Right 05/26/13  . COLONOSCOPY  2009   Dr. Rourk: normal      Current Meds  Medication Sig  . Aspirin-Diphenhydramine (ALKA-SELTZER PM PO) Take by mouth as needed.  . buPROPion (WELLBUTRIN XL) 150 MG 24 hr tablet Take 1 tablet by mouth daily.  . cetirizine (ZYRTEC) 10 MG tablet Take 10 mg daily as needed by mouth for allergies.   . Cholecalciferol (VITAMIN D3) 125 MCG (5000 UT) TABS Take by mouth daily.  . ibuprofen (ADVIL,MOTRIN) 200 MG tablet Take 200 mg every 6 (six) hours as needed by mouth for moderate pain.  . NONFORMULARY OR COMPOUNDED ITEM Hydrocortisone 2.5%, Lidocaine 5%, Phenylephrine 0.25%, Pramoxine 1% hemorrhoid cream TID PRN  . spironolactone (ALDACTONE) 100 MG tablet Take 100 mg by mouth daily.     Family History  Problem Relation Age of Onset  . Cancer Maternal Grandmother          breast  . Cancer Paternal Grandmother        breast  . Other Paternal Grandmother        open heart surgery  . Hypertension Father   . Depression Mother   . Pulmonary embolism Mother   . Hypertension Paternal Grandfather   . Congestive Heart Failure Paternal Grandfather   . Other Paternal Grandfather        open heart surgery  . Colon cancer Neg Hx   . Colon polyps Neg Hx     Social History   Socioeconomic History  . Marital status: Married    Spouse name: Not on file  . Number of  children: Not on file  . Years of education: Not on file  . Highest education level: Not on file  Occupational History  . Not on file  Social Needs  . Financial resource strain: Not on file  . Food insecurity:    Worry: Not on file    Inability: Not on file  . Transportation needs:    Medical: Not on file    Non-medical: Not on file  Tobacco Use  . Smoking status: Never Smoker  . Smokeless tobacco: Never Used  Substance and Sexual Activity  . Alcohol use: Not Currently    Comment: occ.  . Drug use: No  . Sexual activity: Yes    Birth control/protection: None  Lifestyle  . Physical activity:    Days per week: Not on file    Minutes per session: Not on file  . Stress: Not on file  Relationships  . Social connections:    Talks on phone: Not on file    Gets together: Not on file    Attends religious service: Not on file    Active member of club or organization: Not on file    Attends meetings of clubs or organizations: Not on file    Relationship status: Not on file  Other Topics Concern  . Not on file  Social History Narrative  . Not on file       Review of Systems: Gen: Denies fever, chills, anorexia. Denies fatigue, weakness, weight loss.  CV: Denies chest pain, palpitations, syncope, peripheral edema, and claudication. Resp: Denies dyspnea at rest, cough, wheezing, coughing up blood, and pleurisy. GI: see HPI Derm: Denies rash, itching, dry skin Psych: Denies depression, anxiety, memory loss, confusion. No homicidal or suicidal ideation.  Heme: see HPI  Observations/Objective: No distress. Video encounter with patient alert and oriented, well-dressed, maintains eye contact. Pleasant and cooperative.   Assessment and Plan: 38 year old female with intermittent low-volume hematochezia and rectal pain likely due to anal fissure, noting improvement with compounded [facility] cream with nitro. As last colonoscopy was in 2009, she is scheduled for diagnostic  colonoscopy in the near future with Dr. Jena Gaussourk. GERD symptoms have flared recently, and she notes vague dysphagia with rice/bread but not severe. She would like to pursue colonoscopy now as already planned and anticipate EGD/dilatation electively in the future if no improvement with PPI.   Proceed with TCS with Dr. Jena Gaussourk in near future: the risks, benefits, and alternatives have been discussed with the patient in detail. The patient states understanding and desires to proceed. Propofol due to polypharmacy Start Protonix once daily Follow-up in 4 months GERD diet provided.   Follow Up Instructions:    I discussed the assessment and treatment plan with the patient. The patient was provided an opportunity to ask questions and all were answered. The patient agreed  with the plan and demonstrated an understanding of the instructions.   The patient was advised to call back or seek an in-person evaluation if the symptoms worsen or if the condition fails to improve as anticipated.  I provided 10 minutes of  face-to-face time during this encounter.  Gelene Mink, PhD, ANP-BC [facility]

## 2018-10-05 NOTE — Patient Instructions (Addendum)
Keep plans for colonoscopy upcoming with Dr. Jena Gaussourk.   Please start taking Protonix once each morning, 30 minutes before breakfast.   I refilled the hemorrhoid cream for you.  We will see you in 4 months!  I enjoyed seeing you again today! As you know, I value our relationship and want to provide genuine, compassionate, and quality care. I welcome your feedback. If you receive a survey regarding your visit,  I greatly appreciate you taking time to fill this out. See you next time!  Gelene MinkAnna W. Adhrit Krenz, PhD, ANP-BC [facility]   Food Choices for Gastroesophageal Reflux Disease, Adult When you have gastroesophageal reflux disease (GERD), the foods you eat and your eating habits are very important. Choosing the right foods can help ease the discomfort of GERD. Consider working with a diet and nutrition specialist (dietitian) to help you make healthy food choices. What general guidelines should I follow?  Eating plan  Choose healthy foods low in fat, such as fruits, vegetables, whole grains, low-fat dairy products, and lean meat, fish, and poultry.  Eat frequent, small meals instead of three large meals each day. Eat your meals slowly, in a relaxed setting. Avoid bending over or lying down until 2-3 hours after eating.  Limit high-fat foods such as fatty meats or fried foods.  Limit your intake of oils, butter, and shortening to less than 8 teaspoons each day.  Avoid the following: ? Foods that cause symptoms. These may be different for different people. Keep a food diary to keep track of foods that cause symptoms. ? Alcohol. ? Drinking large amounts of liquid with meals. ? Eating meals during the 2-3 hours before bed.  Cook foods using methods other than frying. This may include baking, grilling, or broiling. Lifestyle  Maintain a healthy weight. Ask your health care provider what weight is healthy for you. If you need to lose weight, work with your health care provider to  do so safely.  Exercise for at least 30 minutes on 5 or more days each week, or as told by your health care provider.  Avoid wearing clothes that fit tightly around your waist and chest.  Do not use any products that contain nicotine or tobacco, such as cigarettes and e-cigarettes. If you need help quitting, ask your health care provider.  Sleep with the head of your bed raised. Use a wedge under the mattress or blocks under the bed frame to raise the head of the bed. What foods are not recommended? The items listed may not be a complete list. Talk with your dietitian about what dietary choices are best for you. Grains Pastries or quick breads with added fat. [place] toast. Vegetables Deep fried vegetables. [place] fries. Any vegetables prepared with added fat. Any vegetables that cause symptoms. For some people this may include tomatoes and tomato products, chili peppers, onions and garlic, and horseradish. Fruits Any fruits prepared with added fat. Any fruits that cause symptoms. For some people this may include citrus fruits, such as oranges, grapefruit, pineapple, and lemons. Meats and other protein foods High-fat meats, such as fatty beef or pork, hot dogs, ribs, ham, sausage, salami and bacon. Fried meat or protein, including fried fish and fried chicken. Nuts and nut butters. Dairy Whole milk and chocolate milk. Sour cream. Cream. Ice cream. Cream cheese. Milk shakes. Beverages Coffee and tea, with or without caffeine. Carbonated beverages. Sodas. Energy drinks. Fruit juice made with acidic fruits (such as orange or grapefruit). Tomato juice. Alcoholic drinks. Fats and  oils Butter. Margarine. Shortening. Ghee. Sweets and desserts Chocolate and cocoa. Donuts. Seasoning and other foods Pepper. Peppermint and spearmint. Any condiments, herbs, or seasonings that cause symptoms. For some people, this may include curry, hot sauce, or vinegar-based salad dressings. Summary  When you have  gastroesophageal reflux disease (GERD), food and lifestyle choices are very important to help ease the discomfort of GERD.  Eat frequent, small meals instead of three large meals each day. Eat your meals slowly, in a relaxed setting. Avoid bending over or lying down until 2-3 hours after eating.  Limit high-fat foods such as fatty meat or fried foods. This information is not intended to replace advice given to you by your health care provider. Make sure you discuss any questions you have with your health care provider. Document Released: 04/28/2005 Document Revised: 04/29/2016 Document Reviewed: 04/29/2016 Elsevier Interactive Patient Education  2019 [employer].

## 2018-10-06 ENCOUNTER — Encounter: Payer: Self-pay | Admitting: Internal Medicine

## 2018-10-06 NOTE — Progress Notes (Signed)
cc'ed to pcp °

## 2018-10-12 DIAGNOSIS — F329 Major depressive disorder, single episode, unspecified: Secondary | ICD-10-CM | POA: Diagnosis not present

## 2018-10-13 ENCOUNTER — Other Ambulatory Visit: Payer: Self-pay

## 2018-10-13 ENCOUNTER — Encounter (HOSPITAL_COMMUNITY)
Admission: RE | Admit: 2018-10-13 | Discharge: 2018-10-13 | Disposition: A | Discharge status: Home / Self Care | Payer: BC Managed Care – PPO | Source: Ambulatory Visit | Attending: Internal Medicine | Admitting: Internal Medicine

## 2018-10-13 ENCOUNTER — Encounter (HOSPITAL_COMMUNITY): Payer: Self-pay

## 2018-10-14 ENCOUNTER — Other Ambulatory Visit (HOSPITAL_COMMUNITY)
Admission: RE | Admit: 2018-10-14 | Discharge: 2018-10-14 | Disposition: A | Discharge status: Home / Self Care | Payer: BC Managed Care – PPO | Source: Ambulatory Visit | Attending: Internal Medicine | Admitting: Internal Medicine

## 2018-10-14 DIAGNOSIS — Z1159 Encounter for screening for other viral diseases: Secondary | ICD-10-CM | POA: Diagnosis not present

## 2018-10-16 LAB — NOVEL CORONAVIRUS, NAA (HOSP ORDER, SEND-OUT TO REF LAB; TAT 18-24 HRS): SARS-CoV-2, NAA: NOT DETECTED

## 2018-10-18 ENCOUNTER — Encounter (HOSPITAL_COMMUNITY)
Admission: RE | Disposition: A | Discharge status: Home / Self Care | Payer: Self-pay | Source: Home / Self Care | Attending: Internal Medicine

## 2018-10-18 ENCOUNTER — Ambulatory Visit (HOSPITAL_COMMUNITY): Payer: BC Managed Care – PPO | Admitting: Anesthesiology

## 2018-10-18 ENCOUNTER — Encounter (HOSPITAL_COMMUNITY): Payer: Self-pay | Admitting: *Deleted

## 2018-10-18 ENCOUNTER — Ambulatory Visit (HOSPITAL_COMMUNITY)
Admission: RE | Admit: 2018-10-18 | Discharge: 2018-10-18 | Disposition: A | Discharge status: Home / Self Care | Payer: BC Managed Care – PPO | Attending: Internal Medicine | Admitting: Internal Medicine

## 2018-10-18 ENCOUNTER — Other Ambulatory Visit: Payer: Self-pay

## 2018-10-18 DIAGNOSIS — Z79899 Other long term (current) drug therapy: Secondary | ICD-10-CM | POA: Insufficient documentation

## 2018-10-18 DIAGNOSIS — E282 Polycystic ovarian syndrome: Secondary | ICD-10-CM | POA: Diagnosis not present

## 2018-10-18 DIAGNOSIS — Z8249 Family history of ischemic heart disease and other diseases of the circulatory system: Secondary | ICD-10-CM | POA: Diagnosis not present

## 2018-10-18 DIAGNOSIS — Z7982 Long term (current) use of aspirin: Secondary | ICD-10-CM | POA: Insufficient documentation

## 2018-10-18 DIAGNOSIS — K219 Gastro-esophageal reflux disease without esophagitis: Secondary | ICD-10-CM | POA: Diagnosis not present

## 2018-10-18 DIAGNOSIS — R1310 Dysphagia, unspecified: Secondary | ICD-10-CM | POA: Diagnosis not present

## 2018-10-18 DIAGNOSIS — K921 Melena: Secondary | ICD-10-CM | POA: Diagnosis not present

## 2018-10-18 DIAGNOSIS — F319 Bipolar disorder, unspecified: Secondary | ICD-10-CM | POA: Insufficient documentation

## 2018-10-18 DIAGNOSIS — K640 First degree hemorrhoids: Secondary | ICD-10-CM | POA: Diagnosis not present

## 2018-10-18 DIAGNOSIS — F419 Anxiety disorder, unspecified: Secondary | ICD-10-CM | POA: Diagnosis not present

## 2018-10-18 DIAGNOSIS — K625 Hemorrhage of anus and rectum: Secondary | ICD-10-CM

## 2018-10-18 HISTORY — PX: COLONOSCOPY WITH PROPOFOL: SHX5780

## 2018-10-18 SURGERY — COLONOSCOPY WITH PROPOFOL
Anesthesia: General

## 2018-10-18 MED ORDER — MIDAZOLAM HCL 2 MG/2ML IJ SOLN: 0.5000 mg | Freq: Once | INTRAMUSCULAR | Status: DC | PRN

## 2018-10-18 MED ORDER — GLYCOPYRROLATE 0.2 MG/ML IJ SOLN
INTRAMUSCULAR | Status: DC | PRN
  Administered 2018-10-18: 0.2 mg via INTRAVENOUS

## 2018-10-18 MED ORDER — HYDROMORPHONE HCL 1 MG/ML IJ SOLN: 0.2500 mg | INTRAMUSCULAR | Status: DC | PRN

## 2018-10-18 MED ORDER — PROMETHAZINE HCL 25 MG/ML IJ SOLN: 6.2500 mg | INTRAMUSCULAR | Status: DC | PRN

## 2018-10-18 MED ORDER — LIDOCAINE HCL (CARDIAC) PF 100 MG/5ML IV SOSY
PREFILLED_SYRINGE | INTRAVENOUS | Status: DC | PRN
  Administered 2018-10-18: 40 mg via INTRAVENOUS

## 2018-10-18 MED ORDER — CHLORHEXIDINE GLUCONATE CLOTH 2 % EX PADS: 6.0000 | MEDICATED_PAD | Freq: Once | CUTANEOUS | Status: DC

## 2018-10-18 MED ORDER — HYDROCODONE-ACETAMINOPHEN 7.5-325 MG PO TABS: 1.0000 | ORAL_TABLET | Freq: Once | ORAL | Status: DC | PRN

## 2018-10-18 MED ORDER — KETAMINE HCL 10 MG/ML IJ SOLN
INTRAMUSCULAR | Status: DC | PRN
  Administered 2018-10-18 (×2): 5 mg via INTRAVENOUS
  Administered 2018-10-18: 10 mg via INTRAVENOUS

## 2018-10-18 MED ORDER — PROPOFOL 500 MG/50ML IV EMUL
INTRAVENOUS | Status: DC | PRN
  Administered 2018-10-18: 150 ug/kg/min via INTRAVENOUS

## 2018-10-18 MED ORDER — PROPOFOL 10 MG/ML IV BOLUS
INTRAVENOUS | Status: DC | PRN
  Administered 2018-10-18 (×2): 20 mg via INTRAVENOUS

## 2018-10-18 MED ORDER — PROPOFOL 10 MG/ML IV BOLUS
INTRAVENOUS | Status: AC
  Filled 2018-10-18: qty 40

## 2018-10-18 MED ORDER — LACTATED RINGERS IV SOLN
INTRAVENOUS | Status: DC
  Administered 2018-10-18: 09:00:00 via INTRAVENOUS

## 2018-10-18 NOTE — Anesthesia Preprocedure Evaluation (Signed)
Anesthesia Evaluation  Patient identified by MRN, date of birth, ID band Patient awake    Reviewed: Allergy & Precautions, NPO status , Patient's Chart, lab work & pertinent test results  Airway Mallampati: I  TM Distance: >3 FB Neck ROM: Full    Dental no notable dental hx. (+) Teeth Intact   Pulmonary neg pulmonary ROS,    Pulmonary exam normal breath sounds clear to auscultation       Cardiovascular Exercise Tolerance: Good Normal cardiovascular examI(-) Valvular Problems/Murmurs Rhythm:Regular Rate:Normal     Neuro/Psych  Headaches, Anxiety Bipolar Disorder negative psych ROS   GI/Hepatic Neg liver ROS, GERD  Medicated and Controlled,  Endo/Other  negative endocrine ROS  Renal/GU negative Renal ROS  negative genitourinary   Musculoskeletal negative musculoskeletal ROS (+)   Abdominal   Peds negative pediatric ROS (+)  Hematology negative hematology ROS (+)   Anesthesia Other Findings   Reproductive/Obstetrics negative OB ROS                             Anesthesia Physical Anesthesia Plan  ASA: II  Anesthesia Plan: General   Post-op Pain Management:    Induction: Intravenous  PONV Risk Score and Plan:   Airway Management Planned: Nasal Cannula and Simple Face Mask  Additional Equipment:   Intra-op Plan:   Post-operative Plan:   Informed Consent: I have reviewed the patients History and Physical, chart, labs and discussed the procedure including the risks, benefits and alternatives for the proposed anesthesia with the patient or authorized representative who has indicated his/her understanding and acceptance.     Dental advisory given  Plan Discussed with: CRNA  Anesthesia Plan Comments: (Plan full PPE use  [place]kan GA with GETA backup-d/w pt -WTP same )        Anesthesia Quick Evaluation

## 2018-10-18 NOTE — Anesthesia Postprocedure Evaluation (Signed)
Anesthesia Post Note  Patient: [name]  Procedure(s) Performed: COLONOSCOPY WITH PROPOFOL (N/A )  Patient location during evaluation: PACU Anesthesia Type: General Level of consciousness: awake and alert and oriented Pain management: pain level controlled Vital Signs Assessment: post-procedure vital signs reviewed and stable Respiratory status: spontaneous breathing Cardiovascular status: stable Postop Assessment: no apparent nausea or vomiting Anesthetic complications: no     Last Vitals:  Vitals:   10/18/18 0844 10/18/18 1000  BP: 117/65 108/68  Pulse: 91 92  Resp: 19   Temp: 36.7 C (P) 36.6 C  SpO2: 98% 100%    Last Pain:  Vitals:   10/18/18 0938  TempSrc:   PainSc: 0-No pain                 Curley Hogen A

## 2018-10-18 NOTE — Transfer of Care (Signed)
Immediate Anesthesia Transfer of Care Note  Patient: [name]  Procedure(s) Performed: COLONOSCOPY WITH PROPOFOL (N/A )  Patient Location: PACU  Anesthesia Type:General  Level of Consciousness: awake, oriented and patient cooperative  Airway & Oxygen Therapy: Patient Spontanous Breathing  Post-op Assessment: Report given to RN and Post -op Vital signs reviewed and stable  Post vital signs: Reviewed and stable  Last Vitals:  Vitals Value Taken Time  BP 108/68 10/18/2018 10:01 AM  Temp    Pulse 88 10/18/2018 10:03 AM  Resp 16 10/18/2018 10:03 AM  SpO2 100 % 10/18/2018 10:03 AM  Vitals shown include unvalidated device data.  Last Pain:  Vitals:   10/18/18 0938  TempSrc:   PainSc: 0-No pain         Complications: No apparent anesthesia complications

## 2018-10-18 NOTE — Interval H&P Note (Signed)
History and Physical Interval Note:  10/18/2018 9:35 AM  [name]  has presented today for surgery, with the diagnosis of rectal bleeding.  The various methods of treatment have been discussed with the patient and family. After consideration of risks, benefits and other options for treatment, the patient has consented to  Procedure(s) with comments: COLONOSCOPY WITH PROPOFOL (N/A) - 12:00pm-9:15am as a surgical intervention.  The patient's history has been reviewed, patient examined, no change in status, stable for surgery.  I have reviewed the patient's chart and labs.  Questions were answered to the patient's satisfaction.     Sylvan Lahm  No change.  Colonoscopy for hematochezia.rmr

## 2018-10-18 NOTE — Discharge Instructions (Signed)
Colonoscopy Discharge Instructions  Read the instructions outlined below and refer to this sheet in the next few weeks. These discharge instructions provide you with general information on caring for yourself after you leave the hospital. Your doctor may also give you specific instructions. While your treatment has been planned according to the most current medical practices available, unavoidable complications occasionally occur. If you have any problems or questions after discharge, call Dr. Gala Butler at [phone]. ACTIVITY  You may resume your regular activity, but move at a slower pace for the next 24 hours.   Take frequent rest periods for the next 24 hours.   Walking will help get rid of the air and reduce the bloated feeling in your belly (abdomen).   No driving for 24 hours (because of the medicine (anesthesia) used during the test).    Do not sign any important legal documents or operate any machinery for 24 hours (because of the anesthesia used during the test).  NUTRITION  Drink plenty of fluids.   You may resume your normal diet as instructed by your doctor.   Begin with a light meal and progress to your normal diet. Heavy or fried foods are harder to digest and may make you feel sick to your stomach (nauseated).   Avoid alcoholic beverages for 24 hours or as instructed.  MEDICATIONS  You may resume your normal medications unless your doctor tells you otherwise.  WHAT YOU CAN EXPECT TODAY  Some feelings of bloating in the abdomen.   Passage of more gas than usual.   Spotting of blood in your stool or on the toilet paper.  IF YOU HAD POLYPS REMOVED DURING THE COLONOSCOPY:  No aspirin products for 7 days or as instructed.   No alcohol for 7 days or as instructed.   Eat a soft diet for the next 24 hours.  FINDING OUT THE RESULTS OF YOUR TEST Not all test results are available during your visit. If your test results are not back during the visit, make an appointment  with your caregiver to find out the results. Do not assume everything is normal if you have not heard from your caregiver or the medical facility. It is important for you to follow up on all of your test results.  SEEK IMMEDIATE MEDICAL ATTENTION IF:  You have more than a spotting of blood in your stool.   Your belly is swollen (abdominal distention).   You are nauseated or vomiting.   You have a temperature over 101.   You have abdominal pain or discomfort that is severe or gets worse throughout the day.    Hemorrhoid information provided  Continue [facility] hemorrhoid cream  Begin Benefiber 1 tablespoon daily for 3 weeks; then increase to 1 tablespoon twice daily thereafter  Visit with [place] in 3 months   Hemorrhoids Hemorrhoids are swollen veins in and around the rectum or anus. There are two types of hemorrhoids:  Internal hemorrhoids. These occur in the veins that are just inside the rectum. They may poke through to the outside and become irritated and painful.  External hemorrhoids. These occur in the veins that are outside the anus and can be felt as a painful swelling or hard lump near the anus. Most hemorrhoids do not cause serious problems, and they can be managed with home treatments such as diet and lifestyle changes. If home treatments do not help the symptoms, procedures can be done to shrink or remove the hemorrhoids. What are the causes? This  condition is caused by increased pressure in the anal area. This pressure may result from various things, including:  Constipation.  Straining to have a bowel movement.  Diarrhea.  Pregnancy.  Obesity.  Sitting for long periods of time.  Heavy lifting or other activity that causes you to strain.  Anal sex.  Riding a bike for a long period of time. What are the signs or symptoms? Symptoms of this condition include:  Pain.  Anal itching or irritation.  Rectal bleeding.  Leakage of stool  (feces).  Anal swelling.  One or more lumps around the anus. How is this diagnosed? This condition can often be diagnosed through a visual exam. Other exams or tests may also be done, such as:  An exam that involves feeling the rectal area with a gloved hand (digital rectal exam).  An exam of the anal canal that is done using a small tube (anoscope).  A blood test, if you have lost a significant amount of blood.  A test to look inside the colon using a flexible tube with a camera on the end (sigmoidoscopy or colonoscopy). How is this treated? This condition can usually be treated at home. However, various procedures may be done if dietary changes, lifestyle changes, and other home treatments do not help your symptoms. These procedures can help make the hemorrhoids smaller or remove them completely. Some of these procedures involve surgery, and others do not. Common procedures include:  Rubber band ligation. Rubber bands are placed at the base of the hemorrhoids to cut off their blood supply.  Sclerotherapy. Medicine is injected into the hemorrhoids to shrink them.  Infrared coagulation. A type of light energy is used to get rid of the hemorrhoids.  Hemorrhoidectomy surgery. The hemorrhoids are surgically removed, and the veins that supply them are tied off.  Stapled hemorrhoidopexy surgery. The surgeon staples the base of the hemorrhoid to the rectal wall. Follow these instructions at home: Eating and drinking   Eat foods that have a lot of fiber in them, such as whole grains, beans, nuts, fruits, and vegetables.  Ask your health care provider about taking products that have added fiber (fiber supplements).  Reduce the amount of fat in your diet. You can do this by eating low-fat dairy products, eating less red meat, and avoiding processed foods.  Drink enough fluid to keep your urine pale yellow. Managing pain and swelling   Take warm sitz baths for 20 minutes, 3-4 times a  day to ease pain and discomfort. You may do this in a bathtub or using a portable sitz bath that fits over the toilet.  If directed, apply ice to the affected area. Using ice packs between sitz baths may be helpful. ? Put ice in a plastic bag. ? Place a towel between your skin and the bag. ? Leave the ice on for 20 minutes, 2-3 times a day. General instructions  Take over-the-counter and prescription medicines only as told by your health care provider.  Use medicated creams or suppositories as told.  Get regular exercise. Ask your health care provider how much and what kind of exercise is best for you. In general, you should do moderate exercise for at least 30 minutes on most days of the week (150 minutes each week). This can include activities such as walking, biking, or yoga.  Go to the bathroom when you have the urge to have a bowel movement. Do not wait.  Avoid straining to have bowel movements.  Keep the  anal area dry and clean. Use wet toilet paper or moist towelettes after a bowel movement.  Do not sit on the toilet for long periods of time. This increases blood pooling and pain.  Keep all follow-up visits as told by your health care provider. This is important. Contact a health care provider if you have:  Increasing pain and swelling that are not controlled by treatment or medicine.  Difficulty having a bowel movement, or you are unable to have a bowel movement.  Pain or inflammation outside the area of the hemorrhoids. Get help right away if you have:  Uncontrolled bleeding from your rectum. Summary  Hemorrhoids are swollen veins in and around the rectum or anus.  Most hemorrhoids can be managed with home treatments such as diet and lifestyle changes.  Taking warm sitz baths can help ease pain and discomfort.  In severe cases, procedures or surgery can be done to shrink or remove the hemorrhoids. This information is not intended to replace advice given to you by  your health care provider. Make sure you discuss any questions you have with your health care provider. Document Released: 04/25/2000 Document Revised: 09/17/2017 Document Reviewed: 09/17/2017 Elsevier Interactive Patient Education  2019 [employer]

## 2018-10-18 NOTE — Op Note (Signed)
[facility] Patient Name: [name] Procedure Date: 10/18/2018 9:19 AM MRN: [id number] Date of Birth: [birth year] Attending MD: Gennette Pacobert Michael Kacee Koren , MD CSN: [id number] Age: [age] Admit Type: Outpatient Procedure:                Colonoscopy Indications:              Hematochezia Providers:                Gennette Pacobert Michael Elbia Paro, MD, Criselda PeachesLurae B. Patsy LagerAlbert RN, RN,                            Dyann Ruddleonya Wilson Referring MD:              Medicines:                Propofol per Anesthesia Complications:            No immediate complications. Estimated Blood Loss:     Estimated blood loss: none. Procedure:                Pre-Anesthesia Assessment:                           - Prior to the procedure, a History and Physical                            was performed, and patient medications and                            allergies were reviewed. The patient's tolerance of                            previous anesthesia was also reviewed. The risks                            and benefits of the procedure and the sedation                            options and risks were discussed with the patient.                            All questions were answered, and informed consent                            was obtained. Prior Anticoagulants: The patient has                            taken no previous anticoagulant or antiplatelet                            agents. ASA Grade Assessment: II - A patient with                            mild systemic disease. After reviewing the risks  and benefits, the patient was deemed in                            satisfactory condition to undergo the procedure.                           After obtaining informed consent, the colonoscope                            was passed under direct vision. Throughout the                            procedure, the patient's blood pressure, pulse, and                            oxygen saturations were monitored  continuously. The                            CF-[id number] ([id number] scope was introduced through                            the anus and advanced to the the cecum, identified                            by appendiceal orifice and ileocecal valve. The                            colonoscopy was performed without difficulty. The                            patient tolerated the procedure well. The quality                            of the bowel preparation was adequate. Scope In: 9:42:55 AM Scope Out: 9:55:24 AM Scope Withdrawal Time: 0 hours 6 minutes 36 seconds  Total Procedure Duration: 0 hours 12 minutes 29 seconds  Findings:      The perianal and digital rectal examinations were normal.      Non-bleeding internal hemorrhoids were found during retroflexion. The       hemorrhoids were moderate, medium-sized and Grade I (internal       hemorrhoids that do not prolapse).      The exam was otherwise without abnormality on direct and retroflexion       views. Impression:               - Non-bleeding internal hemorrhoids.                           - The examination was otherwise normal on direct                            and retroflexion views.                           - No specimens collected. Moderate Sedation:      Moderate (  conscious) sedation was personally administered by an       anesthesia professional. The following parameters were monitored: oxygen       saturation, heart rate, blood pressure, respiratory rate, EKG, adequacy       of pulmonary ventilation, and response to care. Recommendation:           - Patient has a contact number available for                            emergencies. The signs and symptoms of potential                            delayed complications were discussed with the                            patient. Return to normal activities tomorrow.                            Written discharge instructions were provided to the                            patient.                            - Resume previous diet.                           - Continue present medications. To new [employer]                            apothecary hemorrhoid cream. Add Benefiber 1                            tablespoon daily for 3 weeks; then increase to                            twice daily thereafter                           - Repeat colonoscopy at age 38 for screening                            purposes.                           - Return to GI office in 3 months. I discussed my                            findings and recommendations with the patient's                            father, Electa Sniffim Smith, at [phone] at patient's                            request. Procedure Code(s):        --- Professional ---  45378, Colonoscopy, flexible; diagnostic, including                            collection of specimen(s) by brushing or washing,                            when performed (separate procedure) Diagnosis Code(s):        --- Professional ---                           K64.0, First degree hemorrhoids                           K92.1, Melena (includes Hematochezia) CPT copyright 2019 American Medical Association. All rights reserved. The codes documented in this report are preliminary and upon coder review may  be revised to meet current compliance requirements. Cristopher Estimable. Sangita Zani, MD Norvel Richards, MD 10/18/2018 10:05:59 AM This report has been signed electronically. Number of Addenda: 0

## 2018-10-19 DIAGNOSIS — F329 Major depressive disorder, single episode, unspecified: Secondary | ICD-10-CM | POA: Diagnosis not present

## 2018-10-25 ENCOUNTER — Encounter (HOSPITAL_COMMUNITY): Payer: Self-pay | Admitting: Internal Medicine

## 2018-10-26 DIAGNOSIS — F329 Major depressive disorder, single episode, unspecified: Secondary | ICD-10-CM | POA: Diagnosis not present

## 2018-11-02 DIAGNOSIS — F329 Major depressive disorder, single episode, unspecified: Secondary | ICD-10-CM | POA: Diagnosis not present

## 2018-11-09 DIAGNOSIS — F329 Major depressive disorder, single episode, unspecified: Secondary | ICD-10-CM | POA: Diagnosis not present

## 2018-11-18 DIAGNOSIS — F329 Major depressive disorder, single episode, unspecified: Secondary | ICD-10-CM | POA: Diagnosis not present

## 2018-11-29 DIAGNOSIS — F329 Major depressive disorder, single episode, unspecified: Secondary | ICD-10-CM | POA: Diagnosis not present

## 2018-12-08 DIAGNOSIS — F329 Major depressive disorder, single episode, unspecified: Secondary | ICD-10-CM | POA: Diagnosis not present

## 2018-12-22 DIAGNOSIS — F329 Major depressive disorder, single episode, unspecified: Secondary | ICD-10-CM | POA: Diagnosis not present

## 2019-01-05 DIAGNOSIS — F329 Major depressive disorder, single episode, unspecified: Secondary | ICD-10-CM | POA: Diagnosis not present

## 2019-01-12 DIAGNOSIS — F329 Major depressive disorder, single episode, unspecified: Secondary | ICD-10-CM | POA: Diagnosis not present

## 2019-01-19 DIAGNOSIS — F329 Major depressive disorder, single episode, unspecified: Secondary | ICD-10-CM | POA: Diagnosis not present

## 2019-01-26 DIAGNOSIS — F329 Major depressive disorder, single episode, unspecified: Secondary | ICD-10-CM | POA: Diagnosis not present

## 2019-02-02 ENCOUNTER — Ambulatory Visit: Payer: BLUE CROSS/BLUE SHIELD | Admitting: Gastroenterology

## 2019-02-02 DIAGNOSIS — F329 Major depressive disorder, single episode, unspecified: Secondary | ICD-10-CM | POA: Diagnosis not present

## 2019-02-10 DIAGNOSIS — R110 Nausea: Secondary | ICD-10-CM | POA: Diagnosis not present

## 2019-02-10 DIAGNOSIS — M791 Myalgia, unspecified site: Secondary | ICD-10-CM | POA: Diagnosis not present

## 2019-02-10 DIAGNOSIS — F329 Major depressive disorder, single episode, unspecified: Secondary | ICD-10-CM | POA: Diagnosis not present

## 2019-02-10 DIAGNOSIS — R0602 Shortness of breath: Secondary | ICD-10-CM | POA: Diagnosis not present

## 2019-02-10 DIAGNOSIS — H6502 Acute serous otitis media, left ear: Secondary | ICD-10-CM | POA: Diagnosis not present

## 2019-02-10 DIAGNOSIS — J358 Other chronic diseases of tonsils and adenoids: Secondary | ICD-10-CM | POA: Diagnosis not present

## 2019-02-10 DIAGNOSIS — R509 Fever, unspecified: Secondary | ICD-10-CM | POA: Diagnosis not present

## 2019-02-10 DIAGNOSIS — H9209 Otalgia, unspecified ear: Secondary | ICD-10-CM | POA: Diagnosis not present

## 2019-02-10 DIAGNOSIS — R05 Cough: Secondary | ICD-10-CM | POA: Diagnosis not present

## 2019-02-23 DIAGNOSIS — F329 Major depressive disorder, single episode, unspecified: Secondary | ICD-10-CM | POA: Diagnosis not present

## 2019-03-02 DIAGNOSIS — F329 Major depressive disorder, single episode, unspecified: Secondary | ICD-10-CM | POA: Diagnosis not present

## 2019-03-16 DIAGNOSIS — F329 Major depressive disorder, single episode, unspecified: Secondary | ICD-10-CM | POA: Diagnosis not present

## 2019-03-23 DIAGNOSIS — F329 Major depressive disorder, single episode, unspecified: Secondary | ICD-10-CM | POA: Diagnosis not present

## 2019-03-30 DIAGNOSIS — F329 Major depressive disorder, single episode, unspecified: Secondary | ICD-10-CM | POA: Diagnosis not present

## 2019-04-06 DIAGNOSIS — F329 Major depressive disorder, single episode, unspecified: Secondary | ICD-10-CM | POA: Diagnosis not present

## 2019-04-13 DIAGNOSIS — F329 Major depressive disorder, single episode, unspecified: Secondary | ICD-10-CM | POA: Diagnosis not present

## 2019-04-20 DIAGNOSIS — F329 Major depressive disorder, single episode, unspecified: Secondary | ICD-10-CM | POA: Diagnosis not present

## 2019-05-04 DIAGNOSIS — F329 Major depressive disorder, single episode, unspecified: Secondary | ICD-10-CM | POA: Diagnosis not present

## 2019-05-07 DIAGNOSIS — Z20828 Contact with and (suspected) exposure to other viral communicable diseases: Secondary | ICD-10-CM | POA: Diagnosis not present

## 2019-05-07 DIAGNOSIS — Z03818 Encounter for observation for suspected exposure to other biological agents ruled out: Secondary | ICD-10-CM | POA: Diagnosis not present

## 2019-05-18 DIAGNOSIS — F329 Major depressive disorder, single episode, unspecified: Secondary | ICD-10-CM | POA: Diagnosis not present

## 2019-05-25 DIAGNOSIS — F329 Major depressive disorder, single episode, unspecified: Secondary | ICD-10-CM | POA: Diagnosis not present

## 2019-06-01 DIAGNOSIS — F329 Major depressive disorder, single episode, unspecified: Secondary | ICD-10-CM | POA: Diagnosis not present

## 2019-06-08 DIAGNOSIS — F329 Major depressive disorder, single episode, unspecified: Secondary | ICD-10-CM | POA: Diagnosis not present

## 2019-06-15 DIAGNOSIS — L709 Acne, unspecified: Secondary | ICD-10-CM | POA: Diagnosis not present

## 2019-06-15 DIAGNOSIS — F329 Major depressive disorder, single episode, unspecified: Secondary | ICD-10-CM | POA: Diagnosis not present

## 2019-06-15 DIAGNOSIS — L680 Hirsutism: Secondary | ICD-10-CM | POA: Diagnosis not present

## 2019-06-15 DIAGNOSIS — L719 Rosacea, unspecified: Secondary | ICD-10-CM | POA: Diagnosis not present

## 2019-06-15 DIAGNOSIS — I10 Essential (primary) hypertension: Secondary | ICD-10-CM | POA: Diagnosis not present

## 2019-06-22 DIAGNOSIS — F329 Major depressive disorder, single episode, unspecified: Secondary | ICD-10-CM | POA: Diagnosis not present

## 2019-07-06 DIAGNOSIS — F329 Major depressive disorder, single episode, unspecified: Secondary | ICD-10-CM | POA: Diagnosis not present

## 2019-07-12 DIAGNOSIS — H9209 Otalgia, unspecified ear: Secondary | ICD-10-CM | POA: Diagnosis not present

## 2019-07-12 DIAGNOSIS — F331 Major depressive disorder, recurrent, moderate: Secondary | ICD-10-CM | POA: Diagnosis not present

## 2019-07-12 DIAGNOSIS — M791 Myalgia, unspecified site: Secondary | ICD-10-CM | POA: Diagnosis not present

## 2019-07-12 DIAGNOSIS — J358 Other chronic diseases of tonsils and adenoids: Secondary | ICD-10-CM | POA: Diagnosis not present

## 2019-07-12 DIAGNOSIS — Z0000 Encounter for general adult medical examination without abnormal findings: Secondary | ICD-10-CM | POA: Diagnosis not present

## 2019-07-12 DIAGNOSIS — H6502 Acute serous otitis media, left ear: Secondary | ICD-10-CM | POA: Diagnosis not present

## 2019-07-12 DIAGNOSIS — R7301 Impaired fasting glucose: Secondary | ICD-10-CM | POA: Diagnosis not present

## 2019-07-12 DIAGNOSIS — G43009 Migraine without aura, not intractable, without status migrainosus: Secondary | ICD-10-CM | POA: Diagnosis not present

## 2019-07-20 DIAGNOSIS — F329 Major depressive disorder, single episode, unspecified: Secondary | ICD-10-CM | POA: Diagnosis not present

## 2019-07-27 DIAGNOSIS — F329 Major depressive disorder, single episode, unspecified: Secondary | ICD-10-CM | POA: Diagnosis not present

## 2019-08-02 DIAGNOSIS — F329 Major depressive disorder, single episode, unspecified: Secondary | ICD-10-CM | POA: Diagnosis not present

## 2019-08-10 DIAGNOSIS — F329 Major depressive disorder, single episode, unspecified: Secondary | ICD-10-CM | POA: Diagnosis not present

## 2019-08-11 DIAGNOSIS — Z23 Encounter for immunization: Secondary | ICD-10-CM | POA: Diagnosis not present

## 2019-08-18 DIAGNOSIS — F329 Major depressive disorder, single episode, unspecified: Secondary | ICD-10-CM | POA: Diagnosis not present

## 2019-08-24 DIAGNOSIS — F329 Major depressive disorder, single episode, unspecified: Secondary | ICD-10-CM | POA: Diagnosis not present

## 2019-09-02 DIAGNOSIS — Z23 Encounter for immunization: Secondary | ICD-10-CM | POA: Diagnosis not present

## 2019-09-07 DIAGNOSIS — F329 Major depressive disorder, single episode, unspecified: Secondary | ICD-10-CM | POA: Diagnosis not present

## 2019-09-14 DIAGNOSIS — F329 Major depressive disorder, single episode, unspecified: Secondary | ICD-10-CM | POA: Diagnosis not present

## 2019-09-21 DIAGNOSIS — F329 Major depressive disorder, single episode, unspecified: Secondary | ICD-10-CM | POA: Diagnosis not present

## 2019-10-05 DIAGNOSIS — F329 Major depressive disorder, single episode, unspecified: Secondary | ICD-10-CM | POA: Diagnosis not present

## 2019-10-19 DIAGNOSIS — F329 Major depressive disorder, single episode, unspecified: Secondary | ICD-10-CM | POA: Diagnosis not present

## 2019-10-27 DIAGNOSIS — F329 Major depressive disorder, single episode, unspecified: Secondary | ICD-10-CM | POA: Diagnosis not present

## 2019-11-02 DIAGNOSIS — F329 Major depressive disorder, single episode, unspecified: Secondary | ICD-10-CM | POA: Diagnosis not present

## 2019-11-17 DIAGNOSIS — F329 Major depressive disorder, single episode, unspecified: Secondary | ICD-10-CM | POA: Diagnosis not present

## 2019-11-24 DIAGNOSIS — F329 Major depressive disorder, single episode, unspecified: Secondary | ICD-10-CM | POA: Diagnosis not present

## 2019-12-01 DIAGNOSIS — F329 Major depressive disorder, single episode, unspecified: Secondary | ICD-10-CM | POA: Diagnosis not present

## 2019-12-08 DIAGNOSIS — F329 Major depressive disorder, single episode, unspecified: Secondary | ICD-10-CM | POA: Diagnosis not present

## 2019-12-13 ENCOUNTER — Other Ambulatory Visit: Payer: Self-pay | Admitting: Gastroenterology

## 2019-12-13 DIAGNOSIS — L709 Acne, unspecified: Secondary | ICD-10-CM | POA: Diagnosis not present

## 2019-12-15 DIAGNOSIS — F329 Major depressive disorder, single episode, unspecified: Secondary | ICD-10-CM | POA: Diagnosis not present

## 2019-12-16 NOTE — Telephone Encounter (Signed)
Patient needs follow ov. I have sent in RX with refills.

## 2019-12-21 ENCOUNTER — Encounter: Payer: Self-pay | Admitting: Internal Medicine

## 2019-12-28 DIAGNOSIS — F329 Major depressive disorder, single episode, unspecified: Secondary | ICD-10-CM | POA: Diagnosis not present

## 2020-01-24 DIAGNOSIS — F329 Major depressive disorder, single episode, unspecified: Secondary | ICD-10-CM | POA: Diagnosis not present

## 2020-02-07 DIAGNOSIS — F329 Major depressive disorder, single episode, unspecified: Secondary | ICD-10-CM | POA: Diagnosis not present

## 2020-02-15 DIAGNOSIS — F329 Major depressive disorder, single episode, unspecified: Secondary | ICD-10-CM | POA: Diagnosis not present

## 2020-03-14 DIAGNOSIS — F329 Major depressive disorder, single episode, unspecified: Secondary | ICD-10-CM | POA: Diagnosis not present

## 2020-03-28 DIAGNOSIS — F329 Major depressive disorder, single episode, unspecified: Secondary | ICD-10-CM | POA: Diagnosis not present

## 2020-04-11 DIAGNOSIS — F329 Major depressive disorder, single episode, unspecified: Secondary | ICD-10-CM | POA: Diagnosis not present

## 2020-04-25 DIAGNOSIS — F329 Major depressive disorder, single episode, unspecified: Secondary | ICD-10-CM | POA: Diagnosis not present

## 2020-05-10 DIAGNOSIS — M7711 Lateral epicondylitis, right elbow: Secondary | ICD-10-CM | POA: Diagnosis not present

## 2020-05-22 DIAGNOSIS — F329 Major depressive disorder, single episode, unspecified: Secondary | ICD-10-CM | POA: Diagnosis not present

## 2020-06-05 DIAGNOSIS — F329 Major depressive disorder, single episode, unspecified: Secondary | ICD-10-CM | POA: Diagnosis not present

## 2020-08-07 DIAGNOSIS — L709 Acne, unspecified: Secondary | ICD-10-CM | POA: Diagnosis not present

## 2020-08-21 DIAGNOSIS — F329 Major depressive disorder, single episode, unspecified: Secondary | ICD-10-CM | POA: Diagnosis not present

## 2020-08-29 DIAGNOSIS — F329 Major depressive disorder, single episode, unspecified: Secondary | ICD-10-CM | POA: Diagnosis not present

## 2020-09-28 ENCOUNTER — Ambulatory Visit: Payer: BC Managed Care – PPO | Admitting: Adult Health

## 2020-10-01 ENCOUNTER — Encounter: Payer: Self-pay | Admitting: Adult Health

## 2020-10-01 ENCOUNTER — Other Ambulatory Visit (HOSPITAL_COMMUNITY)
Admission: RE | Admit: 2020-10-01 | Discharge: 2020-10-01 | Disposition: A | Discharge status: Home / Self Care | Payer: BC Managed Care – PPO | Source: Ambulatory Visit | Attending: Adult Health | Admitting: Adult Health

## 2020-10-01 ENCOUNTER — Ambulatory Visit (INDEPENDENT_AMBULATORY_CARE_PROVIDER_SITE_OTHER): Payer: Self-pay | Admitting: Adult Health

## 2020-10-01 ENCOUNTER — Other Ambulatory Visit: Payer: Self-pay

## 2020-10-01 VITALS — BP 123/86 | HR 92 | Ht 61.0 in | Wt 250.0 lb

## 2020-10-01 DIAGNOSIS — Z13220 Encounter for screening for lipoid disorders: Secondary | ICD-10-CM

## 2020-10-01 DIAGNOSIS — Z1231 Encounter for screening mammogram for malignant neoplasm of breast: Secondary | ICD-10-CM

## 2020-10-01 DIAGNOSIS — N943 Premenstrual tension syndrome: Secondary | ICD-10-CM

## 2020-10-01 DIAGNOSIS — Z01419 Encounter for gynecological examination (general) (routine) without abnormal findings: Secondary | ICD-10-CM | POA: Insufficient documentation

## 2020-10-01 DIAGNOSIS — Z3202 Encounter for pregnancy test, result negative: Secondary | ICD-10-CM

## 2020-10-01 DIAGNOSIS — Z131 Encounter for screening for diabetes mellitus: Secondary | ICD-10-CM

## 2020-10-01 DIAGNOSIS — N921 Excessive and frequent menstruation with irregular cycle: Secondary | ICD-10-CM | POA: Insufficient documentation

## 2020-10-01 DIAGNOSIS — N946 Dysmenorrhea, unspecified: Secondary | ICD-10-CM

## 2020-10-01 DIAGNOSIS — R232 Flushing: Secondary | ICD-10-CM

## 2020-10-01 LAB — POCT URINE PREGNANCY: Preg Test, Ur: NEGATIVE

## 2020-10-01 MED ORDER — SERTRALINE HCL 50 MG PO TABS: 50.0000 mg | ORAL_TABLET | Freq: Every day | ORAL | 6 refills | Status: DC

## 2020-10-01 NOTE — Progress Notes (Signed)
[name], female   DOB: 06-02-1980, 40 y.o.   MRN: [id number] History of Present Illness: [name] is a 40 year old white female, married, G0P0, in complaining of heavy, painful periods and they are coming every 2 weeks. Having hot flashes and PMS, is on Wellbutrin and goes to therapy every 2 weeks. Last pap was in 2018,  PCP is Dr Margo Aye.   Current Medications, Allergies, Past Medical History, Past Surgical History, Family History and Social History were reviewed in [employer] record.     Review of Systems: Patient denies any headaches, hearing loss, fatigue, blurred vision, shortness of breath, chest pain, abdominal pain, problems with bowel movements, urination, or intercourse. No joint pain. +hot flashes +heavy periods for 7 days  Seems to have period every 2 weeks +pain with periods  +PMS   Physical Exam:BP 123/86 (BP Location: Right Arm, Patient Position: Sitting, Cuff Size: Large)   Pulse 92   Ht 5\' 1"  (1.549 m)   Wt 250 lb (113.4 kg)   LMP 09/21/2020 (Approximate)   BMI 47.24 kg/m UPT is negative. General:  Well developed, well nourished, no acute distress Skin:  Warm and dry Lungs; Clear to auscultation bilaterally Breast:  Deferred  Cardiovascular: Regular rate and rhythm Pelvic:  External genitalia is normal in appearance, no lesions.  The vagina is normal in appearance. Urethra has no lesions or masses. The cervix is smooth, pap with HR HPV genotyping performed.  Uterus is felt to be normal size, shape, and contour.  No adnexal masses or tenderness noted.Bladder is non tender, no masses felt. Rectal: Deferred, sees GI Extremities/musculoskeletal:  No swelling or varicosities noted, no clubbing or cyanosis Psych:  No mood changes, alert and cooperative,seems happy Fall risk is low  Upstream - 10/01/20 1227      Pregnancy Intention Screening   Does the patient want to become pregnant in the next year? No    Does the patient's  partner want to become pregnant in the next year? No    Would the patient like to discuss contraceptive options today? No      Contraception Wrap Up   Current Method No Method - Other Reason    End Method No Method - Other Reason         Examination chaperoned by 10/03/20 LPN.  Impression and Plan: 1. Pregnancy examination or test, negative result - POCT urine pregnancy  2. Encounter for gynecological examination with Papanicolaou smear of cervix Pap sent  Physical in 1 year  Pap in 3 if normal  - Cytology - PAP( [place]) Will check labs too, and talk when results back   3. Menorrhagia with irregular cycle Will get Malachy Mood to assess uterus and ovaries in about 4 weeks  - US PELVIS (TRANSABDOMINAL ONLY); Future - US PELVIS TRANSVAGINAL NON-OB (TV ONLY); Future - CBC - Comprehensive metabolic panel - TSH  4. PMS (premenstrual syndrome) Will add zoloft Meds ordered this encounter  Medications  . sertraline (ZOLOFT) 50 MG tablet    Sig: Take 1 tablet (50 mg total) by mouth daily.    Dispense:  30 tablet    Refill:  6    Order Specific Question:   Supervising Provider    Answer:   [place], LUTHER H [[id number]]    5. Screening mammogram for breast cancer Appointment scheduled for her on 10/03/20 at 11:45 am at [facility]  - MM 3D SCREEN BREAST BILATERAL; Future  6. Dysmenorrhea - [facility] PELVIS (  TRANSABDOMINAL ONLY); Future - US PELVIS TRANSVAGINAL NON-OB (TV ONLY); Future  7. Hot flashes Will check FSH  - Follicle stimulating hormone  8. Screening for diabetes mellitus - Hemoglobin A1c  9. Screening cholesterol level - Lipid panel

## 2020-10-02 DIAGNOSIS — N921 Excessive and frequent menstruation with irregular cycle: Secondary | ICD-10-CM | POA: Diagnosis not present

## 2020-10-02 DIAGNOSIS — Z13220 Encounter for screening for lipoid disorders: Secondary | ICD-10-CM | POA: Diagnosis not present

## 2020-10-02 DIAGNOSIS — Z131 Encounter for screening for diabetes mellitus: Secondary | ICD-10-CM | POA: Diagnosis not present

## 2020-10-02 DIAGNOSIS — R232 Flushing: Secondary | ICD-10-CM | POA: Diagnosis not present

## 2020-10-02 LAB — CYTOLOGY - PAP
Comment: NEGATIVE
Diagnosis: NEGATIVE
High risk HPV: NEGATIVE

## 2020-10-03 ENCOUNTER — Other Ambulatory Visit: Payer: Self-pay

## 2020-10-03 ENCOUNTER — Ambulatory Visit (HOSPITAL_COMMUNITY)
Admission: RE | Admit: 2020-10-03 | Discharge: 2020-10-03 | Disposition: A | Discharge status: Home / Self Care | Payer: BC Managed Care – PPO | Source: Ambulatory Visit | Attending: Adult Health | Admitting: Adult Health

## 2020-10-03 DIAGNOSIS — Z1231 Encounter for screening mammogram for malignant neoplasm of breast: Secondary | ICD-10-CM | POA: Diagnosis not present

## 2020-10-03 LAB — HEMOGLOBIN A1C
Est. average glucose Bld gHb Est-mCnc: 117 mg/dL
Hgb A1c MFr Bld: 5.7 % — ABNORMAL HIGH (ref 4.8–5.6)

## 2020-10-03 LAB — LIPID PANEL
Chol/HDL Ratio: 3.6 ratio (ref 0.0–4.4)
Cholesterol, Total: 158 mg/dL (ref 100–199)
HDL: 44 mg/dL (ref >39)
LDL Chol Calc (NIH): 95 mg/dL (ref 0–99)
Triglycerides: 103 mg/dL (ref 0–149)
VLDL Cholesterol Cal: 19 mg/dL (ref 5–40)

## 2020-10-03 LAB — CBC
Hematocrit: 42.3 % (ref 34.0–46.6)
Hemoglobin: 13.7 g/dL (ref 11.1–15.9)
MCH: 30.1 pg (ref 26.6–33.0)
MCHC: 32.4 g/dL (ref 31.5–35.7)
MCV: 93 fL (ref 79–97)
Platelets: 323 10*3/uL (ref 150–450)
RBC: 4.55 x10E6/uL (ref 3.77–5.28)
RDW: 11.8 % (ref 11.7–15.4)
WBC: 8.4 10*3/uL (ref 3.4–10.8)

## 2020-10-03 LAB — COMPREHENSIVE METABOLIC PANEL
ALT: 13 IU/L (ref 0–32)
AST: 11 IU/L (ref 0–40)
Albumin/Globulin Ratio: 2 (ref 1.2–2.2)
Albumin: 4.4 g/dL (ref 3.8–4.8)
Alkaline Phosphatase: 85 IU/L (ref 44–121)
BUN/Creatinine Ratio: 20 (ref 9–23)
BUN: 16 mg/dL (ref 6–24)
Bilirubin Total: 0.2 mg/dL (ref 0.0–1.2)
CO2: 21 mmol/L (ref 20–29)
Calcium: 9.3 mg/dL (ref 8.7–10.2)
Chloride: 100 mmol/L (ref 96–106)
Creatinine, Ser: 0.8 mg/dL (ref 0.57–1.00)
Globulin, Total: 2.2 g/dL (ref 1.5–4.5)
Glucose: 102 mg/dL — ABNORMAL HIGH (ref 65–99)
Potassium: 4.5 mmol/L (ref 3.5–5.2)
Sodium: 136 mmol/L (ref 134–144)
Total Protein: 6.6 g/dL (ref 6.0–8.5)
eGFR: 95 mL/min/{1.73_m2} (ref >59)

## 2020-10-03 LAB — TSH: TSH: 2.33 u[IU]/mL (ref 0.450–4.500)

## 2020-10-03 LAB — FOLLICLE STIMULATING HORMONE: FSH: 6.1 m[IU]/mL

## 2020-10-04 DIAGNOSIS — F329 Major depressive disorder, single episode, unspecified: Secondary | ICD-10-CM | POA: Diagnosis not present

## 2020-10-17 ENCOUNTER — Ambulatory Visit (INDEPENDENT_AMBULATORY_CARE_PROVIDER_SITE_OTHER): Payer: BC Managed Care – PPO

## 2020-10-17 ENCOUNTER — Other Ambulatory Visit: Payer: Self-pay

## 2020-10-17 DIAGNOSIS — N946 Dysmenorrhea, unspecified: Secondary | ICD-10-CM | POA: Diagnosis not present

## 2020-10-17 DIAGNOSIS — N921 Excessive and frequent menstruation with irregular cycle: Secondary | ICD-10-CM

## 2020-10-17 DIAGNOSIS — F329 Major depressive disorder, single episode, unspecified: Secondary | ICD-10-CM | POA: Diagnosis not present

## 2020-10-17 NOTE — Progress Notes (Signed)
PELVIC US TA/TV: homogenous axial positioned uterus,mult small simple myometrial cysts,EEC 5 mm,normal ovaries,no pain during ultrasound,unable to slide ovaries (may be because of location)  [employer]

## 2020-10-18 ENCOUNTER — Telehealth: Payer: Self-pay | Admitting: Adult Health

## 2020-10-18 NOTE — Telephone Encounter (Signed)
Pt aware that US showed normal uterus and ovaries, EEC 5 mm and has small myometrial cysts

## 2020-11-01 DIAGNOSIS — F329 Major depressive disorder, single episode, unspecified: Secondary | ICD-10-CM | POA: Diagnosis not present

## 2020-11-15 DIAGNOSIS — F329 Major depressive disorder, single episode, unspecified: Secondary | ICD-10-CM | POA: Diagnosis not present

## 2020-11-28 DIAGNOSIS — F329 Major depressive disorder, single episode, unspecified: Secondary | ICD-10-CM | POA: Diagnosis not present

## 2020-12-12 DIAGNOSIS — F329 Major depressive disorder, single episode, unspecified: Secondary | ICD-10-CM | POA: Diagnosis not present

## 2021-01-01 ENCOUNTER — Other Ambulatory Visit: Payer: Self-pay | Admitting: Adult Health

## 2021-01-02 DIAGNOSIS — F329 Major depressive disorder, single episode, unspecified: Secondary | ICD-10-CM | POA: Diagnosis not present

## 2021-01-16 DIAGNOSIS — F329 Major depressive disorder, single episode, unspecified: Secondary | ICD-10-CM | POA: Diagnosis not present

## 2021-01-22 DIAGNOSIS — B070 Plantar wart: Secondary | ICD-10-CM | POA: Diagnosis not present

## 2021-02-06 DIAGNOSIS — F329 Major depressive disorder, single episode, unspecified: Secondary | ICD-10-CM | POA: Diagnosis not present

## 2021-02-07 DIAGNOSIS — L709 Acne, unspecified: Secondary | ICD-10-CM | POA: Diagnosis not present

## 2021-04-11 DIAGNOSIS — F329 Major depressive disorder, single episode, unspecified: Secondary | ICD-10-CM | POA: Diagnosis not present

## 2021-05-15 DIAGNOSIS — F329 Major depressive disorder, single episode, unspecified: Secondary | ICD-10-CM | POA: Diagnosis not present

## 2021-05-29 DIAGNOSIS — F329 Major depressive disorder, single episode, unspecified: Secondary | ICD-10-CM | POA: Diagnosis not present

## 2021-06-10 ENCOUNTER — Other Ambulatory Visit: Payer: Self-pay | Admitting: Adult Health

## 2021-06-24 ENCOUNTER — Other Ambulatory Visit: Payer: Self-pay | Admitting: Adult Health

## 2021-06-25 DIAGNOSIS — F329 Major depressive disorder, single episode, unspecified: Secondary | ICD-10-CM | POA: Diagnosis not present

## 2021-07-09 DIAGNOSIS — F329 Major depressive disorder, single episode, unspecified: Secondary | ICD-10-CM | POA: Diagnosis not present

## 2021-07-23 DIAGNOSIS — F329 Major depressive disorder, single episode, unspecified: Secondary | ICD-10-CM | POA: Diagnosis not present

## 2021-08-20 DIAGNOSIS — F329 Major depressive disorder, single episode, unspecified: Secondary | ICD-10-CM | POA: Diagnosis not present

## 2021-09-01 IMAGING — MG MM DIGITAL SCREENING BILAT W/ TOMO AND CAD
8 series · 8 of 24 positions shown · non-contrast
Comparison: Previous exam(s).

ACR Breast Density Category a: The breast tissue is almost entirely
fatty.

CLINICAL DATA: Screening.

EXAM:
DIGITAL SCREENING BILATERAL MAMMOGRAM WITH TOMOSYNTHESIS AND CAD
TECHNIQUE: Bilateral screening digital craniocaudal and mediolateral oblique
mammograms were obtained. Bilateral screening digital breast
tomosynthesis was performed. The images were evaluated with
computer-aided detection.

[R MLO synth-2D]
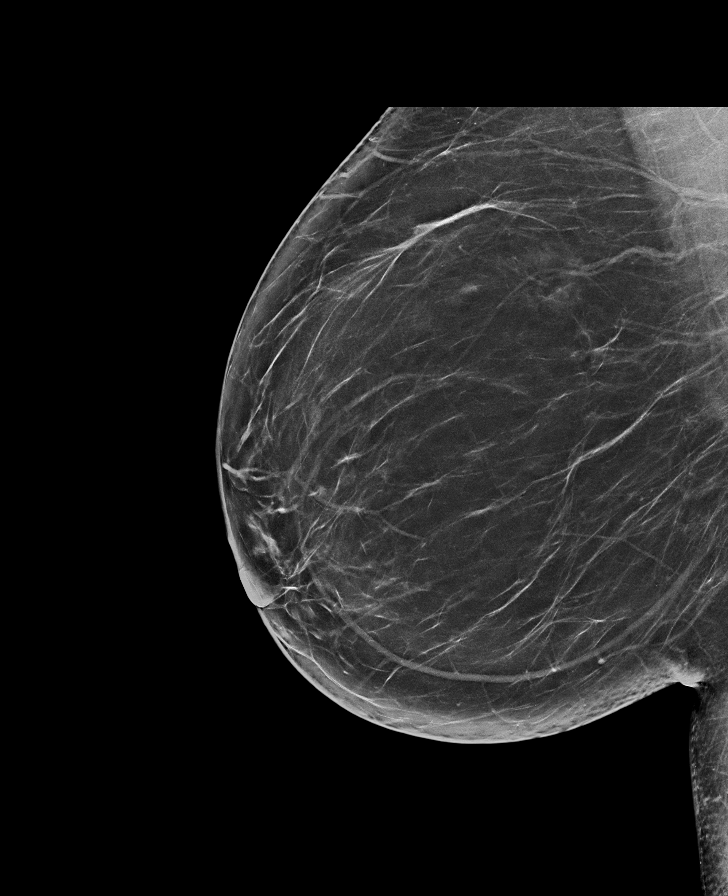

[L CC synth-2D]
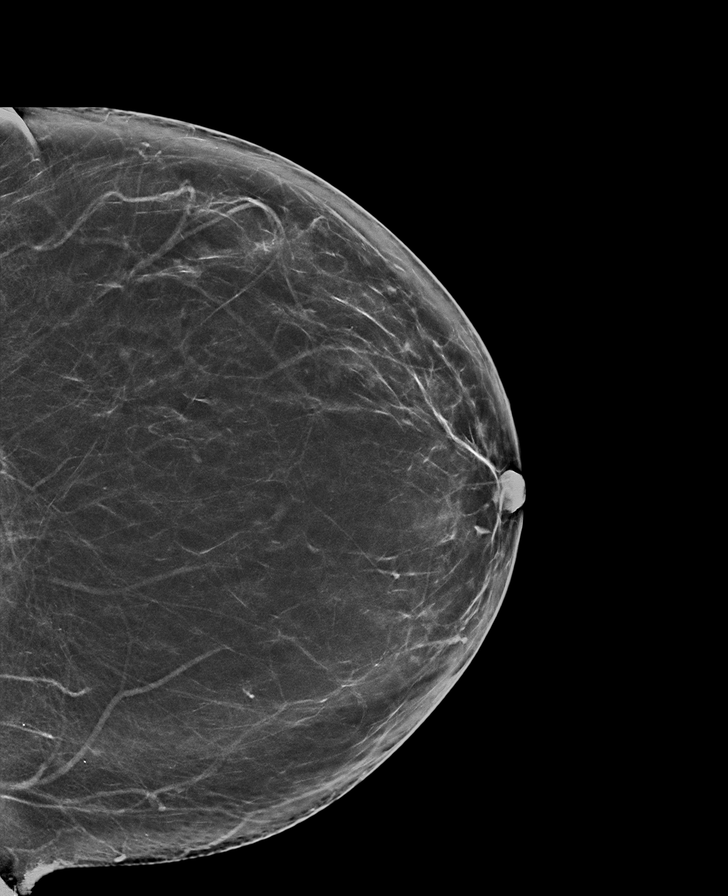

[L MLO synth-2D]
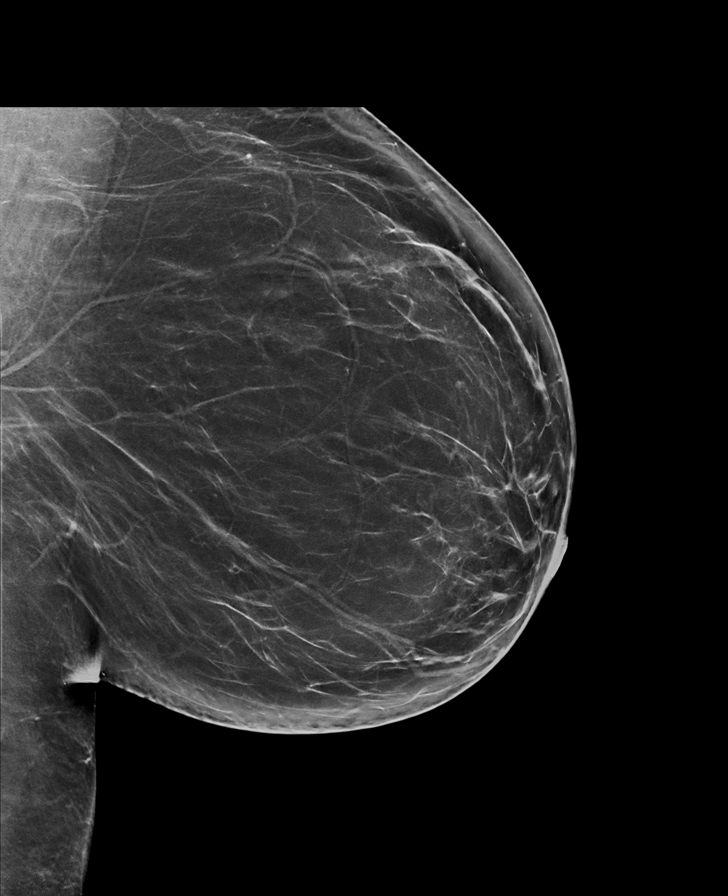

[R CC synth-2D]
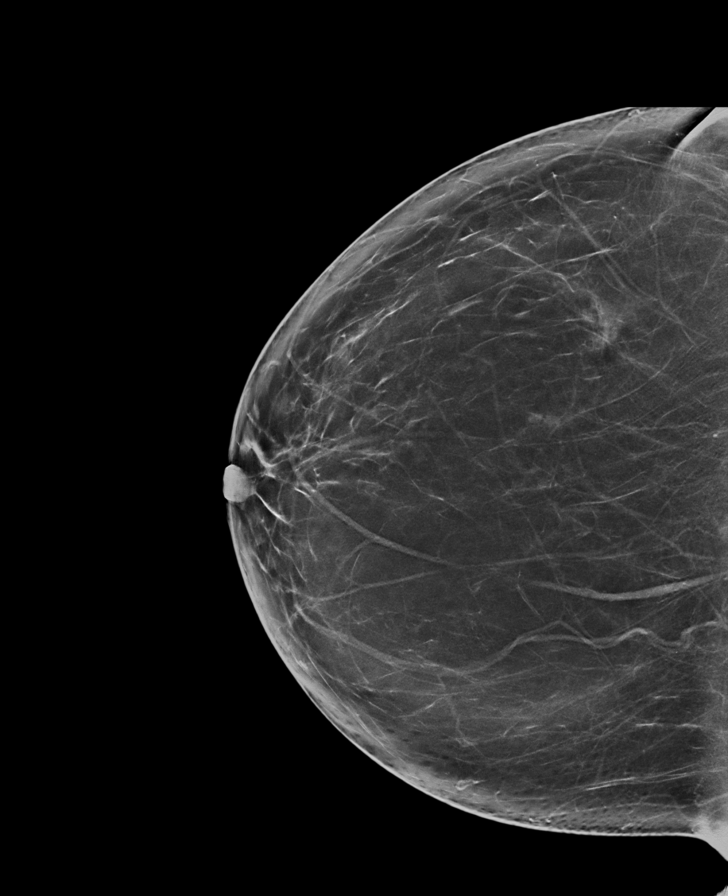

[R CC tomo · tomo slice 41/82.0]
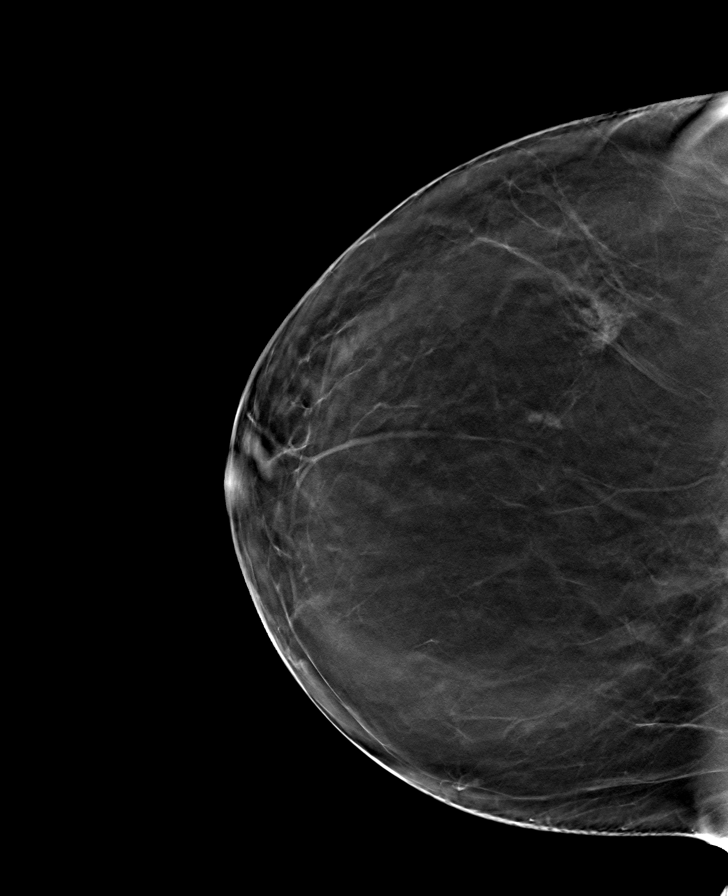

[R MLO tomo · tomo slice 43/84.0]
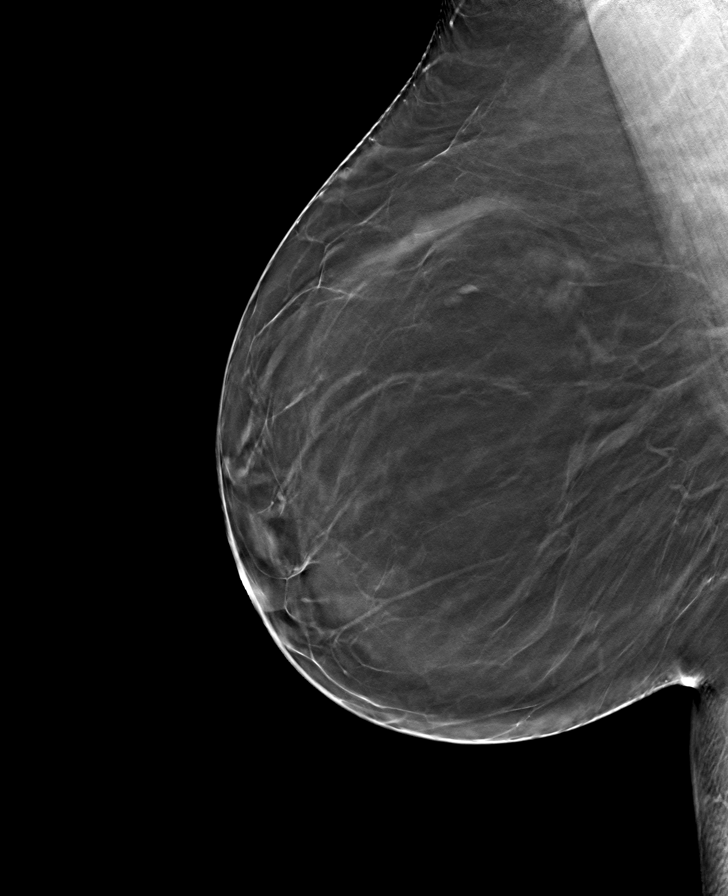

[L MLO tomo · tomo slice 43/86.0]
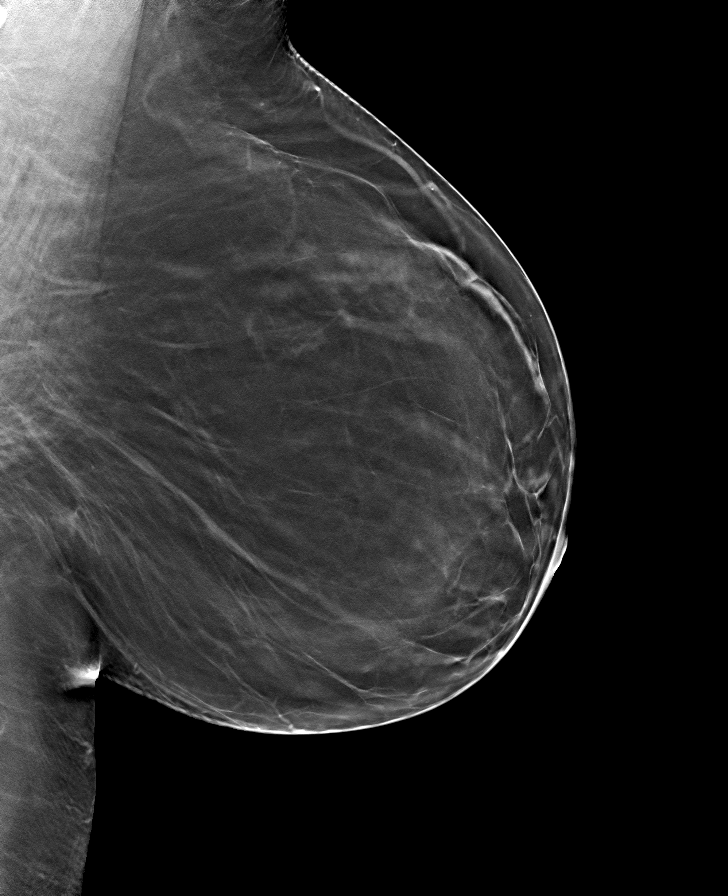

[L CC tomo · tomo slice 38/75.0]
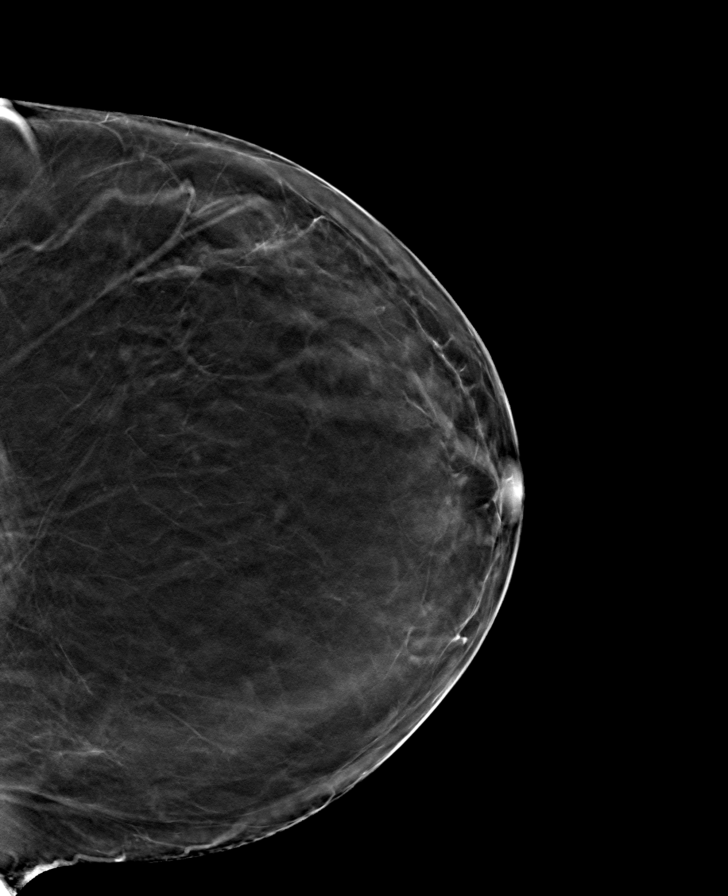

[8 of 24 positions shown; findings below may reference images not displayed]

FINDINGS: There are no findings suspicious for malignancy. The images were
evaluated with computer-aided detection.
IMPRESSION: No mammographic evidence of malignancy. A result letter of this
screening mammogram will be mailed directly to the patient.

RECOMMENDATION:
Screening mammogram in one year. (Code:JP-J-DD5)

BI-RADS CATEGORY  1: Negative.

## 2021-09-03 DIAGNOSIS — F329 Major depressive disorder, single episode, unspecified: Secondary | ICD-10-CM | POA: Diagnosis not present

## 2021-09-19 DIAGNOSIS — H35413 Lattice degeneration of retina, bilateral: Secondary | ICD-10-CM | POA: Diagnosis not present

## 2021-10-23 DIAGNOSIS — F329 Major depressive disorder, single episode, unspecified: Secondary | ICD-10-CM | POA: Diagnosis not present

## 2021-11-05 DIAGNOSIS — F329 Major depressive disorder, single episode, unspecified: Secondary | ICD-10-CM | POA: Diagnosis not present

## 2021-11-19 DIAGNOSIS — F329 Major depressive disorder, single episode, unspecified: Secondary | ICD-10-CM | POA: Diagnosis not present

## 2021-11-26 ENCOUNTER — Other Ambulatory Visit (HOSPITAL_COMMUNITY): Payer: Self-pay | Admitting: Adult Health

## 2021-11-26 DIAGNOSIS — Z1231 Encounter for screening mammogram for malignant neoplasm of breast: Secondary | ICD-10-CM

## 2021-11-29 ENCOUNTER — Encounter (HOSPITAL_COMMUNITY): Payer: Self-pay

## 2021-11-29 ENCOUNTER — Ambulatory Visit (HOSPITAL_COMMUNITY): Payer: Self-pay

## 2021-12-03 DIAGNOSIS — F329 Major depressive disorder, single episode, unspecified: Secondary | ICD-10-CM | POA: Diagnosis not present

## 2022-05-15 ENCOUNTER — Other Ambulatory Visit: Payer: Self-pay | Admitting: Adult Health

## 2022-12-05 ENCOUNTER — Encounter: Payer: Self-pay | Admitting: Adult Health

## 2022-12-05 ENCOUNTER — Other Ambulatory Visit (HOSPITAL_COMMUNITY)
Admission: RE | Admit: 2022-12-05 | Discharge: 2022-12-05 | Disposition: A | Discharge status: Home / Self Care | Payer: Managed Care, Other (non HMO) | Source: Ambulatory Visit | Attending: Adult Health | Admitting: Adult Health

## 2022-12-05 ENCOUNTER — Ambulatory Visit (INDEPENDENT_AMBULATORY_CARE_PROVIDER_SITE_OTHER): Payer: Managed Care, Other (non HMO) | Admitting: Adult Health

## 2022-12-05 VITALS — BP 131/85 | HR 94 | Ht 61.0 in | Wt 264.0 lb

## 2022-12-05 DIAGNOSIS — R102 Pelvic and perineal pain: Secondary | ICD-10-CM | POA: Insufficient documentation

## 2022-12-05 DIAGNOSIS — L680 Hirsutism: Secondary | ICD-10-CM | POA: Insufficient documentation

## 2022-12-05 DIAGNOSIS — R5383 Other fatigue: Secondary | ICD-10-CM | POA: Diagnosis not present

## 2022-12-05 DIAGNOSIS — F419 Anxiety disorder, unspecified: Secondary | ICD-10-CM | POA: Insufficient documentation

## 2022-12-05 DIAGNOSIS — Z01419 Encounter for gynecological examination (general) (routine) without abnormal findings: Secondary | ICD-10-CM

## 2022-12-05 DIAGNOSIS — Z1211 Encounter for screening for malignant neoplasm of colon: Secondary | ICD-10-CM | POA: Insufficient documentation

## 2022-12-05 DIAGNOSIS — R635 Abnormal weight gain: Secondary | ICD-10-CM | POA: Insufficient documentation

## 2022-12-05 DIAGNOSIS — F32A Depression, unspecified: Secondary | ICD-10-CM

## 2022-12-05 DIAGNOSIS — N926 Irregular menstruation, unspecified: Secondary | ICD-10-CM

## 2022-12-05 LAB — HEMOCCULT GUIAC POC 1CARD (OFFICE): Fecal Occult Blood, POC: NEGATIVE

## 2022-12-05 MED ORDER — SPIRONOLACTONE 100 MG PO TABS: 100.0000 mg | ORAL_TABLET | Freq: Every day | ORAL | 2 refills | Status: DC

## 2022-12-05 MED ORDER — SERTRALINE HCL 100 MG PO TABS: 100.0000 mg | ORAL_TABLET | Freq: Every day | ORAL | 4 refills | Status: DC

## 2022-12-05 NOTE — Progress Notes (Signed)
[name], female   DOB: December 22, 1980, 42 y.o.   MRN: [id number] History of Present Illness: [name] is a 42 year old white female, married, G0P0, in for a well woman gyn exam and pap. She is complaining of cramping, bad at times since May, has had weight gain, and feel tired. Periods not regular.  PCP is Dr Margo Aye.   Current Medications, Allergies, Past Medical History, Past Surgical History, Family History and Social History were reviewed in [employer] record.     Review of Systems: Patient denies any headaches, hearing loss, blurred vision, shortness of breath, chest pain, problems with bowel movements, urination, or intercourse. No joint pain or mood swings.  See HPI for positives.   Physical Exam:BP 131/85 (BP Location: Right Arm, Patient Position: Sitting, Cuff Size: Large)   Pulse 94   Ht 5\' 1"  (1.549 m)   Wt 264 lb (119.7 kg)   LMP 11/18/2022 (Approximate)   BMI 49.88 kg/m   General:  Well developed, well nourished, no acute distress Skin:  Warm and dry Neck:  Midline trachea, normal thyroid, good ROM, no lymphadenopathy Lungs; Clear to auscultation bilaterally Breast:  No dominant palpable mass, retraction, or nipple discharge Cardiovascular: Regular rate and rhythm Abdomen:  Soft, non tender, no hepatosplenomegaly Pelvic:  External genitalia is normal in appearance, no lesions.  The vagina is normal in appearance. Urethra has no lesions or masses. The cervix is smooth, pap with HR HPV genotyping performed.   Uterus is felt to be normal size, shape, and contour.  No adnexal masses or tenderness noted.Bladder is non tender, no masses felt. Rectal: Good sphincter tone, no polyps, or hemorrhoids felt.  Hemoccult negative. Extremities/musculoskeletal:  No swelling or varicosities noted, no clubbing or cyanosis Psych:  No mood changes, alert and cooperative,seems happy AA is 2 Fall risk is low    12/05/2022   10:50 AM 07/16/2018   10:36 AM  11/17/2016   10:08 AM  Depression screen PHQ 2/9  Decreased Interest 2 0 3  Down, Depressed, Hopeless 2 0 3  PHQ - 2 Score 4 0 6  Altered sleeping 2 0 3  Tired, decreased energy 3 0 3  Change in appetite 3 0 2  Feeling bad or failure about yourself  2 0 2  Trouble concentrating 3 0 3  Moving slowly or fidgety/restless 2 0 0  Suicidal thoughts 0 0 0  PHQ-9 Score 19 0 19  Difficult doing work/chores  Not difficult at all Somewhat difficult       12/05/2022   10:51 AM  GAD 7 : Generalized Anxiety Score  Nervous, Anxious, on Edge 3  Control/stop worrying 3  Worry too much - different things 3  Trouble relaxing 3  Restless 0  Easily annoyed or irritable 3  Afraid - awful might happen 1  Total GAD 7 Score 16      Upstream - 12/05/22 1057       Pregnancy Intention Screening   Does the patient want to become pregnant in the next year? No    Does the patient's partner want to become pregnant in the next year? No    Would the patient like to discuss contraceptive options today? No      Contraception Wrap Up   Current Method No Method - Other Reason    Reason for No Current Contraceptive Method at Intake ([facility] Only) Other    End Method No Method - Other Reason    Contraception Counseling  Provided No             Examination chaperoned by Malachy Mood LPN  Impression and Plan: 1. Encounter for gynecological examination with Papanicolaou smear of cervix Pap sent  Pap in 3 years if normal Physical in 1 year Getting mammogram in September in [place] - Cytology - PAP( [place])  2. Encounter for screening fecal occult blood testing Hemoccult was negative   3. Tired Will check labs  - CBC - Comprehensive metabolic panel - TSH + free T4  4. Pelvic cramping +cramping since May Pelvic US scheduled for 12/16/22 at [facility] at 12:30 pm to assess uterus and ovaries  - US PELVIC COMPLETE WITH TRANSVAGINAL; Future  5. Weight gain Will check thyroid  - TSH + free T4  6. Anxiety  and depression More irritable On Wellbutrin from PCP Will increase Zoloft to 100 mg 1 daily  Wil follow up in 8 weeks for ROS. Meds ordered this encounter  Medications   sertraline (ZOLOFT) 100 MG tablet    Sig: Take 1 tablet (100 mg total) by mouth daily.    Dispense:  90 tablet    Refill:  4    Order Specific Question:   Supervising Provider    Answer:   Duane Lope H [[id number]]   spironolactone (ALDACTONE) 100 MG tablet    Sig: Take 1 tablet (100 mg total) by mouth daily.    Dispense:  90 tablet    Refill:  2    Order Specific Question:   Supervising Provider    Answer:   Despina Hidden, LUTHER H [[id number]]     7. Irregular periods Will get [place]  and check CBC  - US PELVIC COMPLETE WITH TRANSVAGINAL; Future - CBC  8. Hirsutism Has resolved with aldactone

## 2022-12-16 ENCOUNTER — Ambulatory Visit (HOSPITAL_COMMUNITY): Payer: Managed Care, Other (non HMO)

## 2022-12-22 ENCOUNTER — Ambulatory Visit (HOSPITAL_COMMUNITY)
Admission: RE | Admit: 2022-12-22 | Discharge: 2022-12-22 | Disposition: A | Discharge status: Home / Self Care | Payer: Managed Care, Other (non HMO) | Source: Ambulatory Visit | Attending: Adult Health | Admitting: Adult Health

## 2022-12-22 DIAGNOSIS — N926 Irregular menstruation, unspecified: Secondary | ICD-10-CM | POA: Diagnosis present

## 2022-12-22 DIAGNOSIS — R102 Pelvic and perineal pain: Secondary | ICD-10-CM | POA: Insufficient documentation

## 2023-01-30 ENCOUNTER — Encounter: Payer: Self-pay | Admitting: Adult Health

## 2023-01-30 ENCOUNTER — Ambulatory Visit: Payer: Managed Care, Other (non HMO) | Admitting: Adult Health

## 2023-01-30 DIAGNOSIS — F419 Anxiety disorder, unspecified: Secondary | ICD-10-CM | POA: Diagnosis not present

## 2023-01-30 DIAGNOSIS — F32A Depression, unspecified: Secondary | ICD-10-CM

## 2023-01-30 NOTE — Progress Notes (Signed)
[name], female   DOB: Jul 03, 1980, 42 y.o.   MRN: [id number]   TELEHEALTH GYNECOLOGY VISIT ENCOUNTER NOTE  Provider location: [facility]   Patient location: Home  I connected with [name] on 01/30/23 at  9:10 AM EDT by telephone and verified that I am speaking with the correct person using two identifiers. Patient was unable to do MyChart audiovisual encounter due to technical difficulties, she tried several times.    I discussed the limitations, risks, security and privacy concerns of performing an evaluation and management service by telephone and the availability of in person appointments. I also discussed with the patient that there may be a patient responsible charge related to this service. The patient expressed understanding and agreed to proceed.  PCP is Dr Margo Aye.  History:  [name] is a 42 y.o. G0P0 female being evaluated today for anxiety and depression, increased zoloft to 100 mg 12/05/22 and is feeling much better, husband has even noticed.. She denies any other concerns.       Past Medical History:  Diagnosis Date   Anxiety 01/31/2014   Bipolar 1 disorder (HCC)    Bipolar 1 disorder (HCC) 09/06/2012   Breast lump on left side at 7 o'clock position 09/07/2012   Cystic acne    Excess body and facial hair 02/21/2015   Heart murmur    functional   Heavy periods 02/21/2015   Irregular periods 01/31/2014   Migraines    with aura   Overweight(278.02)    Patient desires pregnancy 01/31/2014   PCO (polycystic ovaries) 12/21/2012   Vaginal discharge 05/17/2013   Yeast vaginitis 05/17/2013   Past Surgical History:  Procedure Laterality Date   BUNIONECTOMY Left 8 2007   BUNIONECTOMY Right 05/26/13   COLONOSCOPY  2009   Dr. Jena Gauss: normal    COLONOSCOPY WITH PROPOFOL N/A 10/18/2018   Procedure: COLONOSCOPY WITH PROPOFOL;  Surgeon: Corbin Ade, MD;  Location: [facility];  Service: Endoscopy;  Laterality: N/A;   12:00pm-9:15am   The following portions of the patient's history were reviewed and updated as appropriate: allergies, current medications, past family history, past medical history, past social history, past surgical history and problem list.   Health Maintenance:  Normal pap and negative HRHPV on 12/05/22.  Getting  mammogram on 02/10/23 in [place].   Review of Systems:  Pertinent items noted in HPI and remainder of comprehensive ROS otherwise negative.  Physical Exam:   General:  Alert, oriented and cooperative.   Mental Status: Normal mood and affect perceived. Normal judgment and thought content.  Physical exam deferred due to nature of the encounter Fall risk is low    01/30/2023    9:49 AM 12/05/2022   10:50 AM 07/16/2018   10:36 AM  Depression screen PHQ 2/9  Decreased Interest 2 2 0  Down, Depressed, Hopeless 0 2 0  PHQ - 2 Score 2 4 0  Altered sleeping 0 2 0  Tired, decreased energy 0 3 0  Change in appetite 0 3 0  Feeling bad or failure about yourself  0 2 0  Trouble concentrating 0 3 0  Moving slowly or fidgety/restless 0 2 0  Suicidal thoughts 0 0 0  PHQ-9 Score 2 19 0  Difficult doing work/chores   Not difficult at all       01/30/2023    9:50 AM 12/05/2022   10:51 AM  GAD 7 : Generalized Anxiety Score  Nervous, Anxious, on Edge  0 3  Control/stop worrying 0 3  Worry too much - different things 0 3  Trouble relaxing 0 3  Restless 0 0  Easily annoyed or irritable 0 3  Afraid - awful might happen 0 1  Total GAD 7 Score 0 16    Upstream - 01/30/23 0948       Pregnancy Intention Screening   Does the patient want to become pregnant in the next year? No    Does the patient's partner want to become pregnant in the next year? No    Would the patient like to discuss contraceptive options today? No      Contraception Wrap Up   Current Method Female Condom    End Method Female Condom               Labs and Imaging No results found for this or any previous visit  (from the past 336 hour(s)). No results found.    Assessment and Plan:     1. Anxiety and depression Continue Zoloft 100 mg 1 daily has refills she is on Wellbutrin from PCP too    Has physical in July 2025   I discussed the assessment and treatment plan with the patient. The patient was provided an opportunity to ask questions and all were answered. The patient agreed with the plan and demonstrated an understanding of the instructions.   The patient was advised to call back or seek an in-person evaluation/go to the ED if the symptoms worsen or if the condition fails to improve as anticipated.  I provided 10 minutes of non-face-to-face time during this encounter.   Cyril Mourning, NP [facility] [employer], [facility]

## 2023-05-05 ENCOUNTER — Ambulatory Visit: Payer: Managed Care, Other (non HMO)

## 2024-01-08 ENCOUNTER — Other Ambulatory Visit: Payer: Self-pay | Admitting: Adult Health
